# Patient Record
Sex: Female | Born: 1960 | Race: Black or African American | Hispanic: No | Marital: Married | Smoking: Never smoker
Health system: Southern US, Community
[De-identification: ages and names within clinical notes are randomized; demographics above are authoritative.]

## PROBLEM LIST (undated history)

## (undated) DIAGNOSIS — M1990 Unspecified osteoarthritis, unspecified site: Secondary | ICD-10-CM

## (undated) DIAGNOSIS — E739 Lactose intolerance, unspecified: Secondary | ICD-10-CM

## (undated) DIAGNOSIS — B1920 Unspecified viral hepatitis C without hepatic coma: Secondary | ICD-10-CM

## (undated) DIAGNOSIS — K219 Gastro-esophageal reflux disease without esophagitis: Secondary | ICD-10-CM

## (undated) DIAGNOSIS — E669 Obesity, unspecified: Secondary | ICD-10-CM

## (undated) DIAGNOSIS — R002 Palpitations: Secondary | ICD-10-CM

## (undated) DIAGNOSIS — M549 Dorsalgia, unspecified: Secondary | ICD-10-CM

## (undated) DIAGNOSIS — G43909 Migraine, unspecified, not intractable, without status migrainosus: Secondary | ICD-10-CM

## (undated) DIAGNOSIS — E559 Vitamin D deficiency, unspecified: Secondary | ICD-10-CM

## (undated) DIAGNOSIS — I10 Essential (primary) hypertension: Secondary | ICD-10-CM

## (undated) DIAGNOSIS — M25569 Pain in unspecified knee: Secondary | ICD-10-CM

## (undated) DIAGNOSIS — J45909 Unspecified asthma, uncomplicated: Secondary | ICD-10-CM

## (undated) DIAGNOSIS — T7840XA Allergy, unspecified, initial encounter: Secondary | ICD-10-CM

## (undated) DIAGNOSIS — Z91018 Allergy to other foods: Secondary | ICD-10-CM

## (undated) HISTORY — DX: Gastro-esophageal reflux disease without esophagitis: K21.9

## (undated) HISTORY — DX: Pain in unspecified knee: M25.569

## (undated) HISTORY — DX: Lactose intolerance, unspecified: E73.9

## (undated) HISTORY — DX: Allergy to other foods: Z91.018

## (undated) HISTORY — PX: POLYDACTYLY RECONSTRUCTION: SHX439

## (undated) HISTORY — DX: Unspecified viral hepatitis C without hepatic coma: B19.20

## (undated) HISTORY — DX: Allergy, unspecified, initial encounter: T78.40XA

## (undated) HISTORY — DX: Obesity, unspecified: E66.9

## (undated) HISTORY — DX: Unspecified osteoarthritis, unspecified site: M19.90

## (undated) HISTORY — DX: Dorsalgia, unspecified: M54.9

## (undated) HISTORY — DX: Palpitations: R00.2

## (undated) HISTORY — DX: Vitamin D deficiency, unspecified: E55.9

## (undated) HISTORY — DX: Migraine, unspecified, not intractable, without status migrainosus: G43.909

---

## 1990-09-03 HISTORY — PX: OVARIAN CYST REMOVAL: SHX89

## 1995-09-04 HISTORY — PX: ABDOMINAL HYSTERECTOMY: SHX81

## 2012-10-23 DIAGNOSIS — J40 Bronchitis, not specified as acute or chronic: Secondary | ICD-10-CM | POA: Insufficient documentation

## 2012-10-23 DIAGNOSIS — J329 Chronic sinusitis, unspecified: Secondary | ICD-10-CM | POA: Insufficient documentation

## 2016-01-02 DIAGNOSIS — R7402 Elevation of levels of lactic acid dehydrogenase (LDH): Secondary | ICD-10-CM | POA: Insufficient documentation

## 2016-01-05 DIAGNOSIS — E669 Obesity, unspecified: Secondary | ICD-10-CM | POA: Insufficient documentation

## 2016-05-31 DIAGNOSIS — R499 Unspecified voice and resonance disorder: Secondary | ICD-10-CM | POA: Insufficient documentation

## 2016-06-13 DIAGNOSIS — R490 Dysphonia: Secondary | ICD-10-CM | POA: Insufficient documentation

## 2016-06-13 DIAGNOSIS — K219 Gastro-esophageal reflux disease without esophagitis: Secondary | ICD-10-CM | POA: Insufficient documentation

## 2016-08-03 ENCOUNTER — Emergency Department (HOSPITAL_COMMUNITY)
Admission: EM | Admit: 2016-08-03 | Discharge: 2016-08-03 | Disposition: A | Discharge status: Home / Self Care | Payer: Self-pay | Attending: Emergency Medicine | Admitting: Emergency Medicine

## 2016-08-03 ENCOUNTER — Encounter (HOSPITAL_COMMUNITY): Payer: Self-pay

## 2016-08-03 ENCOUNTER — Inpatient Hospital Stay (HOSPITAL_COMMUNITY): Payer: BLUE CROSS/BLUE SHIELD

## 2016-08-03 ENCOUNTER — Encounter (HOSPITAL_COMMUNITY): Payer: Self-pay | Admitting: *Deleted

## 2016-08-03 ENCOUNTER — Inpatient Hospital Stay (HOSPITAL_COMMUNITY)
Admission: AD | Admit: 2016-08-03 | Discharge: 2016-08-03 | Disposition: A | Discharge status: Home / Self Care | Payer: BLUE CROSS/BLUE SHIELD | Source: Ambulatory Visit | Attending: Obstetrics & Gynecology | Admitting: Obstetrics & Gynecology

## 2016-08-03 DIAGNOSIS — K5900 Constipation, unspecified: Secondary | ICD-10-CM

## 2016-08-03 DIAGNOSIS — I10 Essential (primary) hypertension: Secondary | ICD-10-CM | POA: Diagnosis not present

## 2016-08-03 DIAGNOSIS — H8110 Benign paroxysmal vertigo, unspecified ear: Secondary | ICD-10-CM | POA: Diagnosis not present

## 2016-08-03 DIAGNOSIS — R200 Anesthesia of skin: Secondary | ICD-10-CM | POA: Diagnosis present

## 2016-08-03 DIAGNOSIS — J45909 Unspecified asthma, uncomplicated: Secondary | ICD-10-CM | POA: Insufficient documentation

## 2016-08-03 DIAGNOSIS — R109 Unspecified abdominal pain: Secondary | ICD-10-CM | POA: Insufficient documentation

## 2016-08-03 DIAGNOSIS — R42 Dizziness and giddiness: Secondary | ICD-10-CM | POA: Insufficient documentation

## 2016-08-03 DIAGNOSIS — Z5321 Procedure and treatment not carried out due to patient leaving prior to being seen by health care provider: Secondary | ICD-10-CM | POA: Insufficient documentation

## 2016-08-03 HISTORY — DX: Essential (primary) hypertension: I10

## 2016-08-03 HISTORY — DX: Unspecified asthma, uncomplicated: J45.909

## 2016-08-03 LAB — URINALYSIS, ROUTINE W REFLEX MICROSCOPIC
Bilirubin Urine: NEGATIVE
Glucose, UA: NEGATIVE mg/dL
HGB URINE DIPSTICK: NEGATIVE
Ketones, ur: NEGATIVE mg/dL
LEUKOCYTES UA: NEGATIVE
NITRITE: NEGATIVE
PROTEIN: NEGATIVE mg/dL
SPECIFIC GRAVITY, URINE: 1.015 (ref 1.005–1.030)
pH: 8 (ref 5.0–8.0)

## 2016-08-03 LAB — CBC
HCT: 43.5 % (ref 36.0–46.0)
HEMOGLOBIN: 15.1 g/dL — AB (ref 12.0–15.0)
MCH: 31.1 pg (ref 26.0–34.0)
MCHC: 34.7 g/dL (ref 30.0–36.0)
MCV: 89.5 fL (ref 78.0–100.0)
PLATELETS: 196 10*3/uL (ref 150–400)
RBC: 4.86 MIL/uL (ref 3.87–5.11)
RDW: 13.8 % (ref 11.5–15.5)
WBC: 4.5 10*3/uL (ref 4.0–10.5)

## 2016-08-03 LAB — BASIC METABOLIC PANEL
ANION GAP: 8 (ref 5–15)
BUN: 8 mg/dL (ref 6–20)
CHLORIDE: 102 mmol/L (ref 101–111)
CO2: 27 mmol/L (ref 22–32)
Calcium: 9.4 mg/dL (ref 8.9–10.3)
Creatinine, Ser: 0.66 mg/dL (ref 0.44–1.00)
Glucose, Bld: 115 mg/dL — ABNORMAL HIGH (ref 65–99)
Potassium: 3.2 mmol/L — ABNORMAL LOW (ref 3.5–5.1)
SODIUM: 137 mmol/L (ref 135–145)

## 2016-08-03 MED ORDER — POTASSIUM CHLORIDE CRYS ER 20 MEQ PO TBCR
40.0000 meq | EXTENDED_RELEASE_TABLET | Freq: Once | ORAL | Status: DC
  Filled 2016-08-03: qty 2

## 2016-08-03 MED ORDER — POLYETHYLENE GLYCOL 3350 17 G PO PACK: 17.0000 g | PACK | Freq: Every day | ORAL | 0 refills | Status: DC

## 2016-08-03 MED ORDER — ONDANSETRON 4 MG PO TBDP: 4.0000 mg | ORAL_TABLET | Freq: Once | ORAL | Status: DC

## 2016-08-03 MED ORDER — MECLIZINE HCL 25 MG PO TABS
25.0000 mg | ORAL_TABLET | Freq: Once | ORAL | Status: AC
  Administered 2016-08-03: 25 mg via ORAL
  Filled 2016-08-03: qty 1

## 2016-08-03 MED ORDER — MECLIZINE HCL 25 MG PO TABS: ORAL_TABLET | ORAL | 0 refills | Status: DC

## 2016-08-03 NOTE — ED Notes (Signed)
Pt family member came up to the desk very angry about wait time. Attempted to reassure family, pt appears to be resting well in wheelchair. Unable to be reasoned with, family member wheeled patient out of the ER. Unable to reassess vitals.

## 2016-08-03 NOTE — Discharge Instructions (Signed)
Epley Maneuver Self-Care WHAT IS THE EPLEY MANEUVER? The Epley maneuver is an exercise you can do to relieve symptoms of benign paroxysmal positional vertigo (BPPV). This condition is often just referred to as vertigo. BPPV is caused by the movement of tiny crystals (canaliths) inside your inner ear. The accumulation and movement of canaliths in your inner ear causes a sudden spinning sensation (vertigo) when you move your head to certain positions. Vertigo usually lasts about 30 seconds. BPPV usually occurs in just one ear. If you get vertigo when you lie on your left side, you probably have BPPV in your left ear. Your health care provider can tell you which ear is involved.  BPPV may be caused by a head injury. Many people older than 50 get BPPV for unknown reasons. If you have been diagnosed with BPPV, your health care provider may teach you how to do this maneuver. BPPV is not life threatening (benign) and usually goes away in time.  WHEN SHOULD I PERFORM THE EPLEY MANEUVER? You can do this maneuver at home whenever you have symptoms of vertigo. You may do the Epley maneuver up to 3 times a day until your symptoms of vertigo go away. HOW SHOULD I DO THE EPLEY MANEUVER? 1. Sit on the edge of a bed or table with your back straight. Your legs should be extended or hanging over the edge of the bed or table.  2. Turn your head halfway toward the affected ear.  3. Lie backward quickly with your head turned until you are lying flat on your back. You may want to position a pillow under your shoulders.  4. Hold this position for 30 seconds. You may experience an attack of vertigo. This is normal. Hold this position until the vertigo stops. 5. Then turn your head to the opposite direction until your unaffected ear is facing the floor.  6. Hold this position for 30 seconds. You may experience an attack of vertigo. This is normal. Hold this position until the vertigo stops. 7. Now turn your whole body to  the same side as your head. Hold for another 30 seconds.  8. You can then sit back up. ARE THERE RISKS TO THIS MANEUVER? In some cases, you may have other symptoms (such as changes in your vision, weakness, or numbness). If you have these symptoms, stop doing the maneuver and call your health care provider. Even if doing these maneuvers relieves your vertigo, you may still have dizziness. Dizziness is the sensation of light-headedness but without the sensation of movement. Even though the Epley maneuver may relieve your vertigo, it is possible that your symptoms will return within 5 years. WHAT SHOULD I DO AFTER THIS MANEUVER? After doing the Epley maneuver, you can return to your normal activities. Ask your doctor if there is anything you should do at home to prevent vertigo. This may include:  Sleeping with two or more pillows to keep your head elevated.  Not sleeping on the side of your affected ear.  Getting up slowly from bed.  Avoiding sudden movements during the day.  Avoiding extreme head movement, like looking up or bending over.  Wearing a cervical collar to prevent sudden head movements. WHAT SHOULD I DO IF MY SYMPTOMS GET WORSE? Call your health care provider if your vertigo gets worse. Call your provider right way if you have other symptoms, including:   Nausea.  Vomiting.  Headache.  Weakness.  Numbness.  Vision changes. This information is not intended to replace  advice given to you by your health care provider. Make sure you discuss any questions you have with your health care provider. Document Released: 08/25/2013 Document Reviewed: 08/25/2013 Elsevier Interactive Patient Education  2017 [employer]    Benign Positional Vertigo Introduction Vertigo is the feeling that you or your surroundings are moving when they are not. Benign positional vertigo is the most common form of vertigo. The cause of this condition is not serious (is benign). This condition is  triggered by certain movements and positions (is positional). This condition can be dangerous if it occurs while you are doing something that could endanger you or others, such as driving. What are the causes? In many cases, the cause of this condition is not known. It may be caused by a disturbance in an area of the inner ear that helps your brain to sense movement and balance. This disturbance can be caused by a viral infection (labyrinthitis), head injury, or repetitive motion. What increases the risk? This condition is more likely to develop in:  Women.  People who are 16 years of age or older. What are the signs or symptoms? Symptoms of this condition usually happen when you move your head or your eyes in different directions. Symptoms may start suddenly, and they usually last for less than a minute. Symptoms may include:  Loss of balance and falling.  Feeling like you are spinning or moving.  Feeling like your surroundings are spinning or moving.  Nausea and vomiting.  Blurred vision.  Dizziness.  Involuntary eye movement (nystagmus). Symptoms can be mild and cause only slight annoyance, or they can be severe and interfere with daily life. Episodes of benign positional vertigo may return (recur) over time, and they may be triggered by certain movements. Symptoms may improve over time. How is this diagnosed? This condition is usually diagnosed by medical history and a physical exam of the head, neck, and ears. You may be referred to a health care provider who specializes in ear, nose, and throat (ENT) problems (otolaryngologist) or a provider who specializes in disorders of the nervous system (neurologist). You may have additional testing, including:  MRI.  A CT scan.  Eye movement tests. Your health care provider may ask you to change positions quickly while he or she watches you for symptoms of benign positional vertigo, such as nystagmus. Eye movement may be tested with an  electronystagmogram (ENG), caloric stimulation, the Dix-Hallpike test, or the roll test.  An electroencephalogram (EEG). This records electrical activity in your brain.  Hearing tests. How is this treated? Usually, your health care provider will treat this by moving your head in specific positions to adjust your inner ear back to normal. Surgery may be needed in severe cases, but this is rare. In some cases, benign positional vertigo may resolve on its own in 2-4 weeks. Follow these instructions at home: Safety  Move slowly.Avoid sudden body or head movements.  Avoid driving.  Avoid operating heavy machinery.  Avoid doing any tasks that would be dangerous to you or others if a vertigo episode would occur.  If you have trouble walking or keeping your balance, try using a cane for stability. If you feel dizzy or unstable, sit down right away.  Return to your normal activities as told by your health care provider. Ask your health care provider what activities are safe for you. General instructions  Take over-the-counter and prescription medicines only as told by your health care provider.  Avoid certain positions or movements  as told by your health care provider.  Drink enough fluid to keep your urine clear or pale yellow.  Keep all follow-up visits as told by your health care provider. This is important. Contact a health care provider if:  You have a fever.  Your condition gets worse or you develop new symptoms.  Your family or friends notice any behavioral changes.  Your nausea or vomiting gets worse.  You have numbness or a pins and needles sensation. Get help right away if:  You have difficulty speaking or moving.  You are always dizzy.  You faint.  You develop severe headaches.  You have weakness in your legs or arms.  You have changes in your hearing or vision.  You develop a stiff neck.  You develop sensitivity to light. This information is not intended  to replace advice given to you by your health care provider. Make sure you discuss any questions you have with your health care provider. Document Released: 05/28/2006 Document Revised: 01/26/2016 Document Reviewed: 12/13/2014  2017 Elsevier

## 2016-08-03 NOTE — MAU Provider Note (Signed)
Chief Complaint: Back Pain; Dizziness; Emesis; and facial numbness (started today)   SUBJECTIVE HPI: Judith Morgan is a 55 y.o. [id number] at Unknown who presents [facility] reporting multiple complaints: right sided flank pain, dizziness/unable to walk, left sided facial paresthesia.  Patient reports since Monday of having right sided flank pain. This has been persistent for the past 5 days. Patient has tried half a tablet of 7.5/325 Norco's without relief. She has had normal daily soft bowel movements without hematochezia or melena. Patient states she does have to take a stool softener every day. She denies any nausea or vomiting until today, in which she had 2 episodes of nonbilious and nonbloody emesis. Patient states she tried Zofran without relief of her emesis.   She also started having episodes of dizziness today where she is having a difficult time walking. She says that her head just doesn't feel right with the dizziness.  She denies any headaches. She states for the past day or 2 she has also been experiencing left-sided facial numbness and tingling. She denies any urinary symptoms. She denies any visual problems.  She states her only medical problem is hypertension in which she takes medication for and has been well controlled. She states she has had a colonoscopy that was normal, and she has no history of diabetes with an A1c in the normal range per patient.  Patient was originally at the [facility] ED prior to arrival to the MAU. Patient was waiting for an hour and felt that she needed to be seen sooner and therefore she left the ER and came to the MAU. She refuses to go back to [facility] ER, states she would rather go home and deal with her pain and dizziness..  Past Medical History:  Diagnosis Date  . Asthma   . Hypertension    OB History  Gravida Para Term Preterm AB Living  5 3 3   2 2   SAB TAB Ectopic Multiple Live Births               # Outcome Date GA Lbr  Len/2nd Weight Sex Delivery Anes PTL Lv  5 AB           4 AB           3 Term           2 Term           1 Term              Past Surgical History:  Procedure Laterality Date  . ABDOMINAL HYSTERECTOMY    . OVARIAN CYST REMOVAL     Social History   Social History  . Marital status: Single    Spouse name: N/A  . Number of children: N/A  . Years of education: N/A   Occupational History  . Not on file.   Social History Main Topics  . Smoking status: Never Smoker  . Smokeless tobacco: Never Used  . Alcohol use No  . Drug use: No  . Sexual activity: Not on file   Other Topics Concern  . Not on file   Social History Narrative  . No narrative on file   No current facility-administered medications on file prior to encounter.    No current outpatient prescriptions on file prior to encounter.   Allergies  Allergen Reactions  . Other     Red peppers    I have reviewed the past Medical Hx, Surgical Hx, Social Hx,  Allergies and Medications.   REVIEW OF SYSTEMS  GENERAL: no fevers/chills, no fluctuation in her weight, +generalized weakness, +shakiness RESPIRATORY: no cough, shortness of breath, or wheezing CARDIOVASCULAR: no chest pain or dyspnea on exertion GASTROINTESTINAL: Right sided flank pain, denies change in bowel habits, or black or bloody stools.  GENITO-URINARY: no dysuria, trouble voiding, or hematuria MUSKULOSKELETAL: negative for - swelling in bilateral ankles, feet, legs;  Generalized weakness NEUROLOGICAL: +dizziness, +gait disturbance (2/2 dizziness), +facial numbness/tingling on left side, negative for -  Headaches, visual changes, slurred speech, facial drooping DERMATOLOGICAL: negative for rash   OBJECTIVE Patient Vitals for the past 24 hrs:  BP Temp Temp src Pulse Resp SpO2  08/03/16 1623 139/77 97.4 F (36.3 C) Oral 82 18 98 %    PHYSICAL EXAM Constitutional: Well-developed, well-nourished female in no acute distress.  HEENT: no scleral  icterus; PERRL Cardiovascular: normal rate, rhythm, no murmurs; no carotid bruits noted; good pulses bilateral wrist and ankles Respiratory: normal rate and effort, CTAB GI: Abd soft, tender on right side mid-abdomen flank area; non-distended. Pos BS x 4 MS: Extremities nontender, no edema, normal ROM Neurologic: Alert and oriented x 4; CN 2-12 grossly normal; pronator test normal; heel-to-shin test negative; when attempted to walk, became weak and shaky and felt dizzy, could not complete task.  GU: Neg CVAT.   LAB RESULTS Results for orders placed or performed during the hospital encounter of 08/03/16 (from the past 24 hour(s))  Urinalysis, Routine w reflex microscopic (not at [facility])     Status: None   Collection Time: 08/03/16  4:30 PM  Result Value Ref Range   Color, Urine YELLOW YELLOW   APPearance CLEAR CLEAR   Specific Gravity, Urine 1.015 1.005 - 1.030   pH 8.0 5.0 - 8.0   Glucose, UA NEGATIVE NEGATIVE mg/dL   Hgb urine dipstick NEGATIVE NEGATIVE   Bilirubin Urine NEGATIVE NEGATIVE   Ketones, ur NEGATIVE NEGATIVE mg/dL   Protein, ur NEGATIVE NEGATIVE mg/dL   Nitrite NEGATIVE NEGATIVE   Leukocytes, UA NEGATIVE NEGATIVE    IMAGING Dg Abd 2 Views  Result Date: 08/03/2016 CLINICAL DATA:  Right upper back pain, nausea and vomiting. EXAM: ABDOMEN - 2 VIEW COMPARISON:  None. FINDINGS: There is stool throughout large bowel consistent with constipation. No bowel obstruction is noted. There is no organomegaly nor pneumoperitoneum. No suspicious osseous abnormalities. No suspicious calculi. Small bone island or overlying phlebolith involving the right superior pubic ramus. Right intratrochanteric cystic nonaggressive appearing lesion possibly an intertrochanteric cyst or in the spectrum of fibro-osseous lesions. L4-5 and L5-S1 facet arthropathy. IMPRESSION: Increased colonic stool burden consistent with constipation. 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 Electronically Signed   By: Ashley Royalty M.D.   On: 08/03/2016 19:25    MAU COURSE Abd XR EKG Labs Orthostatics Meclizine Potassium - patient could not tolerate   MDM Plan of care reviewed with patient, including labs and tests ordered and medical treatment. Discussed if symptoms worsen or if stroke like symptoms occur, to return to an ER such as [facility] or [facility].    ASSESSMENT 1. Abdominal pain, unspecified abdominal location   2. Abdominal pain   3. Constipation, unspecified constipation type   4. Benign paroxysmal positional vertigo, unspecified laterality     PLAN Discharge home in stable condition. Follow up with PCP Return to another ER if emergency occurs.     Medication List    TAKE these medications   amLODipine 10 MG  tablet Commonly known as:  NORVASC Take 10 mg by mouth daily.   docusate sodium 50 MG capsule Commonly known as:  COLACE Take 50 mg by mouth 2 (two) times daily.   ibuprofen 200 MG tablet Commonly known as:  ADVIL,MOTRIN Take 200 mg by mouth every 6 (six) hours as needed for mild pain.   meclizine 25 MG tablet Commonly known as:  ANTIVERT Take 1-2 tabs PO BID as needed for dizziness.   multivitamin with minerals Tabs tablet Take 1 tablet by mouth daily.   omeprazole 40 MG capsule Commonly known as:  PRILOSEC Take 40 mg by mouth daily.   polyethylene glycol packet Commonly known as:  MIRALAX Take 17 g by mouth daily.   ranitidine 150 MG tablet Commonly known as:  ZANTAC Take 150 mg by mouth at bedtime.   triamterene-hydrochlorothiazide 37.5-25 MG capsule Commonly known as:  DYAZIDE Take 1 capsule by mouth daily.        Katherine Basset, DO [place] [facility] 08/03/2016 6:11 PM

## 2016-08-03 NOTE — ED Triage Notes (Signed)
Pt reports right flank pain since Monday. Denies urinary symptoms or fever. Reports onset yesterday of dizziness, today has left side facial tingling. No neuro deficits noted at triage, grips equal, no facial droop noted and speech is clear.

## 2016-08-03 NOTE — MAU Note (Signed)
Back pain since Monday night, onset of dizziness and left side of face numbness, vomited x 2 today, has history of asthma and high blood pressure, hysterectomy.

## 2016-08-15 ENCOUNTER — Emergency Department (HOSPITAL_COMMUNITY)
Admission: EM | Admit: 2016-08-15 | Discharge: 2016-08-15 | Disposition: A | Discharge status: Home / Self Care | Payer: BLUE CROSS/BLUE SHIELD | Attending: Emergency Medicine | Admitting: Emergency Medicine

## 2016-08-15 ENCOUNTER — Emergency Department (HOSPITAL_COMMUNITY): Payer: BLUE CROSS/BLUE SHIELD

## 2016-08-15 ENCOUNTER — Encounter (HOSPITAL_COMMUNITY): Payer: Self-pay | Admitting: Emergency Medicine

## 2016-08-15 DIAGNOSIS — Z79899 Other long term (current) drug therapy: Secondary | ICD-10-CM | POA: Diagnosis not present

## 2016-08-15 DIAGNOSIS — E876 Hypokalemia: Secondary | ICD-10-CM | POA: Insufficient documentation

## 2016-08-15 DIAGNOSIS — R109 Unspecified abdominal pain: Secondary | ICD-10-CM | POA: Diagnosis not present

## 2016-08-15 DIAGNOSIS — I10 Essential (primary) hypertension: Secondary | ICD-10-CM | POA: Insufficient documentation

## 2016-08-15 DIAGNOSIS — J45909 Unspecified asthma, uncomplicated: Secondary | ICD-10-CM | POA: Diagnosis not present

## 2016-08-15 LAB — URINALYSIS, ROUTINE W REFLEX MICROSCOPIC
BACTERIA UA: NONE SEEN
Bilirubin Urine: NEGATIVE
GLUCOSE, UA: NEGATIVE mg/dL
HGB URINE DIPSTICK: NEGATIVE
KETONES UR: NEGATIVE mg/dL
LEUKOCYTES UA: NEGATIVE
NITRITE: NEGATIVE
PROTEIN: 30 mg/dL — AB
Specific Gravity, Urine: 1.02 (ref 1.005–1.030)
WBC UA: NONE SEEN WBC/hpf (ref 0–5)
pH: 5 (ref 5.0–8.0)

## 2016-08-15 LAB — CBC WITH DIFFERENTIAL/PLATELET
BASOS ABS: 0 10*3/uL (ref 0.0–0.1)
BASOS PCT: 0 %
Eosinophils Absolute: 0.2 10*3/uL (ref 0.0–0.7)
Eosinophils Relative: 3 %
HEMATOCRIT: 46 % (ref 36.0–46.0)
HEMOGLOBIN: 15.8 g/dL — AB (ref 12.0–15.0)
Lymphocytes Relative: 51 %
Lymphs Abs: 2.6 10*3/uL (ref 0.7–4.0)
MCH: 31.4 pg (ref 26.0–34.0)
MCHC: 34.3 g/dL (ref 30.0–36.0)
MCV: 91.5 fL (ref 78.0–100.0)
Monocytes Absolute: 0.4 10*3/uL (ref 0.1–1.0)
Monocytes Relative: 8 %
NEUTROS ABS: 1.9 10*3/uL (ref 1.7–7.7)
NEUTROS PCT: 38 %
Platelets: 230 10*3/uL (ref 150–400)
RBC: 5.03 MIL/uL (ref 3.87–5.11)
RDW: 14 % (ref 11.5–15.5)
WBC: 5 10*3/uL (ref 4.0–10.5)

## 2016-08-15 LAB — COMPREHENSIVE METABOLIC PANEL
ALK PHOS: 119 U/L (ref 38–126)
ALT: 113 U/L — ABNORMAL HIGH (ref 14–54)
ANION GAP: 10 (ref 5–15)
AST: 114 U/L — ABNORMAL HIGH (ref 15–41)
Albumin: 4.4 g/dL (ref 3.5–5.0)
BILIRUBIN TOTAL: 1 mg/dL (ref 0.3–1.2)
BUN: 11 mg/dL (ref 6–20)
CALCIUM: 9.7 mg/dL (ref 8.9–10.3)
CO2: 28 mmol/L (ref 22–32)
Chloride: 101 mmol/L (ref 101–111)
Creatinine, Ser: 0.62 mg/dL (ref 0.44–1.00)
GFR calc non Af Amer: >60 mL/min (ref >60)
Glucose, Bld: 80 mg/dL (ref 65–99)
Potassium: 2.7 mmol/L — CL (ref 3.5–5.1)
SODIUM: 139 mmol/L (ref 135–145)
TOTAL PROTEIN: 9.1 g/dL — AB (ref 6.5–8.1)

## 2016-08-15 MED ORDER — IOPAMIDOL (ISOVUE-300) INJECTION 61%
100.0000 mL | Freq: Once | INTRAVENOUS | Status: AC | PRN
  Administered 2016-08-15: 100 mL via INTRAVENOUS

## 2016-08-15 MED ORDER — POTASSIUM CHLORIDE CRYS ER 20 MEQ PO TBCR
40.0000 meq | EXTENDED_RELEASE_TABLET | Freq: Once | ORAL | Status: AC
  Administered 2016-08-15: 40 meq via ORAL
  Filled 2016-08-15: qty 2

## 2016-08-15 MED ORDER — IOPAMIDOL (ISOVUE-300) INJECTION 61%
INTRAVENOUS | Status: AC
  Filled 2016-08-15: qty 100

## 2016-08-15 MED ORDER — POTASSIUM CHLORIDE CRYS ER 20 MEQ PO TBCR: 20.0000 meq | EXTENDED_RELEASE_TABLET | Freq: Two times a day (BID) | ORAL | 0 refills | Status: DC

## 2016-08-15 MED ORDER — SODIUM CHLORIDE 0.9 % IJ SOLN
INTRAMUSCULAR | Status: AC
  Filled 2016-08-15: qty 50

## 2016-08-15 MED ORDER — TRAMADOL HCL 50 MG PO TABS: 50.0000 mg | ORAL_TABLET | Freq: Four times a day (QID) | ORAL | 0 refills | Status: AC | PRN

## 2016-08-15 NOTE — ED Provider Notes (Signed)
[facility] DEPT Provider Note   CSN: [id number] Arrival date & time: 08/15/16  1203     History   Chief Complaint Chief Complaint  Patient presents with  . Flank Pain    HPI Judith Morgan is a 55 y.o. female.Complains of right flank pain, nonradiating onset 2.5 weeks ago. Pain is worse with moving and improved with remaining still. She's had intermittent vomiting for the past 2.5 weeks. She last vomited yesterday. She feels mildly nauseated now. Last bowel movement yesterday, normal. She was seen at [facility] on 08/03/2016 had lab evaluation which is normal. She has treated herself with Norco which made her feel nauseated and dizzy. She has also treated herself with ibuprofen which improved his pain. Pain is also improved by lying in right lateral decubitus position on a pillow. She denies abdominal pain denies chest pain denies cough denies shortness of breath. No other associated symptoms  HPI  Past Medical History:  Diagnosis Date  . Asthma   . Hypertension     There are no active problems to display for this patient.   Past Surgical History:  Procedure Laterality Date  . ABDOMINAL HYSTERECTOMY    . OVARIAN CYST REMOVAL      OB History    Gravida Para Term Preterm AB Living   5 3 3   2 2    SAB TAB Ectopic Multiple Live Births                   Home Medications    Prior to Admission medications   Medication Sig Start Date End Date Taking? Authorizing Provider  amLODipine (NORVASC) 10 MG tablet Take 10 mg by mouth daily.    Historical Provider, MD  docusate sodium (COLACE) 50 MG capsule Take 50 mg by mouth 2 (two) times daily.    Historical Provider, MD  ibuprofen (ADVIL,MOTRIN) 200 MG tablet Take 200 mg by mouth every 6 (six) hours as needed for mild pain.    Historical Provider, MD  meclizine (ANTIVERT) 25 MG tablet Take 1-2 tabs PO BID as needed for dizziness. 08/03/16   Lauralyn Primes Mumaw, DO  Multiple Vitamin (MULTIVITAMIN WITH MINERALS)  TABS tablet Take 1 tablet by mouth daily.    Historical Provider, MD  omeprazole (PRILOSEC) 40 MG capsule Take 40 mg by mouth daily.    Historical Provider, MD  polyethylene glycol (MIRALAX) packet Take 17 g by mouth daily. 08/03/16   Elizabeth Woodland Mumaw, DO  ranitidine (ZANTAC) 150 MG tablet Take 150 mg by mouth at bedtime.    Historical Provider, MD  triamterene-hydrochlorothiazide (DYAZIDE) 37.5-25 MG capsule Take 1 capsule by mouth daily.    Historical Provider, MD    Family History No family history on file.  Social History Social History  Substance Use Topics  . Smoking status: Never Smoker  . Smokeless tobacco: Never Used  . Alcohol use No     Allergies   Other   Review of Systems Review of Systems  Constitutional: Negative.   HENT: Negative.   Respiratory: Negative.   Cardiovascular: Negative.   Gastrointestinal: Positive for nausea and vomiting.  Genitourinary: Positive for flank pain.  Skin: Negative.   Neurological: Negative.   Psychiatric/Behavioral: Negative.   All other systems reviewed and are negative.    Physical Exam Updated Vital Signs BP 132/78   Pulse 82   Temp 97.8 F (36.6 C)   Resp 18   SpO2 94%   Physical Exam  Constitutional: She appears well-developed and  well-nourished.  HENT:  Head: Normocephalic and atraumatic.  Eyes: Conjunctivae are normal. Pupils are equal, round, and reactive to light.  Neck: Neck supple. No tracheal deviation present. No thyromegaly present.  Cardiovascular: Normal rate and regular rhythm.   No murmur heard. Pulmonary/Chest: Effort normal and breath sounds normal.  Abdominal: Soft. Bowel sounds are normal. She exhibits no distension. There is no tenderness.  Musculoskeletal: Normal range of motion. She exhibits no edema or tenderness.  Neurological: She is alert. Coordination normal.  Skin: Skin is warm and dry. No rash noted.  Psychiatric: She has a normal mood and affect.  Nursing note and vitals  reviewed.    ED Treatments / Results  Labs (all labs ordered are listed, but only abnormal results are displayed) Labs Reviewed  URINALYSIS, ROUTINE W REFLEX MICROSCOPIC  COMPREHENSIVE METABOLIC PANEL  CBC WITH DIFFERENTIAL/PLATELET  URINALYSIS, ROUTINE W REFLEX MICROSCOPIC    EKG  EKG Interpretation None       Radiology No results found.  Procedures Procedures (including critical care time)  Medications Ordered in ED Medications - No data to display  Results for orders placed or performed during the hospital encounter of 08/15/16  Comprehensive metabolic panel  Result Value Ref Range   Sodium 139 135 - 145 mmol/L   Potassium 2.7 (LL) 3.5 - 5.1 mmol/L   Chloride 101 101 - 111 mmol/L   CO2 28 22 - 32 mmol/L   Glucose, Bld 80 65 - 99 mg/dL   BUN 11 6 - 20 mg/dL   Creatinine, Ser 0.62 0.44 - 1.00 mg/dL   Calcium 9.7 8.9 - 10.3 mg/dL   Total Protein 9.1 (H) 6.5 - 8.1 g/dL   Albumin 4.4 3.5 - 5.0 g/dL   AST 114 (H) 15 - 41 U/L   ALT 113 (H) 14 - 54 U/L   Alkaline Phosphatase 119 38 - 126 U/L   Total Bilirubin 1.0 0.3 - 1.2 mg/dL   GFR calc non Af Amer >60 >60 mL/min   GFR calc Af Amer >60 >60 mL/min   Anion gap 10 5 - 15  CBC with Differential/Platelet  Result Value Ref Range   WBC 5.0 4.0 - 10.5 K/uL   RBC 5.03 3.87 - 5.11 MIL/uL   Hemoglobin 15.8 (H) 12.0 - 15.0 g/dL   HCT 46.0 36.0 - 46.0 %   MCV 91.5 78.0 - 100.0 fL   MCH 31.4 26.0 - 34.0 pg   MCHC 34.3 30.0 - 36.0 g/dL   RDW 14.0 11.5 - 15.5 %   Platelets 230 150 - 400 K/uL   Neutrophils Relative % 38 %   Neutro Abs 1.9 1.7 - 7.7 K/uL   Lymphocytes Relative 51 %   Lymphs Abs 2.6 0.7 - 4.0 K/uL   Monocytes Relative 8 %   Monocytes Absolute 0.4 0.1 - 1.0 K/uL   Eosinophils Relative 3 %   Eosinophils Absolute 0.2 0.0 - 0.7 K/uL   Basophils Relative 0 %   Basophils Absolute 0.0 0.0 - 0.1 K/uL  Urinalysis, Routine w reflex microscopic  Result Value Ref Range   Color, Urine AMBER (A) YELLOW    APPearance HAZY (A) CLEAR   Specific Gravity, Urine 1.020 1.005 - 1.030   pH 5.0 5.0 - 8.0   Glucose, UA NEGATIVE NEGATIVE mg/dL   Hgb urine dipstick NEGATIVE NEGATIVE   Bilirubin Urine NEGATIVE NEGATIVE   Ketones, ur NEGATIVE NEGATIVE mg/dL   Protein, ur 30 (A) NEGATIVE mg/dL   Nitrite NEGATIVE NEGATIVE  Leukocytes, UA NEGATIVE NEGATIVE   RBC / HPF 0-5 0 - 5 RBC/hpf   WBC, UA NONE SEEN 0 - 5 WBC/hpf   Bacteria, UA NONE SEEN NONE SEEN   Squamous Epithelial / LPF 0-5 (A) NONE SEEN   Mucous PRESENT    Ca Oxalate Crys, UA PRESENT    Ct Abdomen Pelvis W Contrast  Result Date: 08/15/2016 CLINICAL DATA:  55 year old female with a history of right flank pain for 2 weeks EXAM: CT ABDOMEN AND PELVIS WITH CONTRAST TECHNIQUE: Multidetector CT imaging of the abdomen and pelvis was performed using the standard protocol following bolus administration of intravenous contrast. CONTRAST:  131mL ISOVUE-300 IOPAMIDOL (ISOVUE-300) INJECTION 61% COMPARISON:  None. FINDINGS: Lower chest: Unremarkable lung bases. No pleural effusion. No pericardial fluid/ thickening. Hepatobiliary: No focal liver abnormality is seen. No gallstones, gallbladder wall thickening, or biliary dilatation.Unremarkable gallbladder. Pancreas: No peripancreatic fluid or inflammatory changes. Spleen: Normal in size without focal abnormality. Adrenals/Urinary Tract: Right adrenal gland unremarkable.  Left adrenal gland unremarkable. Right kidney: No right-sided hydronephrosis. No nephrolithiasis. Left Kidney: No left-sided hydronephrosis or nephrolithiasis. There is no evidence of left or right perinephric stranding. Course of the bilateral ureters unremarkable. Unremarkable appearance of the urinary bladder. Stomach/Bowel: Unremarkable appearance of stomach and small bowel. No transition point. Normal appendix. No significant stool burden. Significant diverticular changes. No evidence of acute inflammatory changes. Vascular/Lymphatic: No  significant vascular calcifications. No aneurysm. No adenopathy within the abdomen. Reproductive: Hysterectomy Other: Nonspecific pelvic floor laxity. Musculoskeletal: No displaced fracture. Mild degenerative changes of the visualized thoracolumbar spine. Mild degenerative changes of the bilateral hips. IMPRESSION: No acute finding to account for the patient's flank pain. Extensive diverticular changes without evidence of acute diverticulitis. Signed, Dulcy Fanny. Earleen Newport, DO [facility] Radiology Electronically Signed   By: Corrie Mckusick D.O.   On: 08/15/2016 18:58   Dg Abd 2 Views  Result Date: 08/03/2016 CLINICAL DATA:  Right upper back pain, nausea and vomiting. EXAM: ABDOMEN - 2 VIEW COMPARISON:  None. FINDINGS: There is stool throughout large bowel consistent with constipation. No bowel obstruction is noted. There is no organomegaly nor pneumoperitoneum. No suspicious osseous abnormalities. No suspicious calculi. Small bone island or overlying phlebolith involving the right superior pubic ramus. Right intratrochanteric cystic nonaggressive appearing lesion possibly an intertrochanteric cyst or in the spectrum of fibro-osseous lesions. L4-5 and L5-S1 facet arthropathy. IMPRESSION: Increased colonic stool burden consistent with constipation. 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 Electronically Signed   By: Ashley Royalty M.D.   On: 08/03/2016 19:25   Initial Impression / Assessment and Plan / ED Course  I have reviewed the triage vital signs and the nursing notes.  Pertinent labs & imaging results that were available during my care of the patient were reviewed by me and considered in my medical decision making (see chart for details).  Clinical Course    7:30 PM patient resting comfortably. Pain felt to be muscular in etiology. Etiology of vomiting is unclear however no nausea presently. Patient  hypokalemic. She'll receive potassium chloride oral supplement prior to presentation. Prescription K Dur, tramadol. Follow-up with PMD next week for recheck of serum potassium. Hypokalemia likely secondary to vomiting and triamterene-HCTZ  Final Clinical Impressions(s) / ED Diagnoses  Diagnosis #1 right flank pain #2 hypokalemia Final diagnoses:  None    New Prescriptions New Prescriptions   No medications on file     Orlie Dakin, MD 08/15/16 1937

## 2016-08-15 NOTE — ED Triage Notes (Signed)
Per pt, states right flank pain for 2 weeks-states no dysuria-states worked up at [employer] nausea and vomiting on and off for 2 weeks

## 2016-08-15 NOTE — Discharge Instructions (Signed)
Take the medications as prescribed. Contact your primary care physician tomorrow to arrange to be seen next week. Your blood potassium level should be rechecked. Today's was low at 2.7. Return if concern for any reason.

## 2016-08-15 NOTE — ED Notes (Signed)
Patient is refusing to allow staff to start IV unless it is in certain area of her arm.  Two staff members looked but no IV access.  Therefore, it is a delay in blood.

## 2016-11-06 ENCOUNTER — Other Ambulatory Visit: Payer: BLUE CROSS/BLUE SHIELD

## 2016-11-06 DIAGNOSIS — B182 Chronic viral hepatitis C: Secondary | ICD-10-CM

## 2016-11-06 LAB — COMPREHENSIVE METABOLIC PANEL
ALK PHOS: 142 U/L — AB (ref 33–130)
ALT: 131 U/L — AB (ref 6–29)
AST: 128 U/L — AB (ref 10–35)
Albumin: 3.9 g/dL (ref 3.6–5.1)
BILIRUBIN TOTAL: 0.6 mg/dL (ref 0.2–1.2)
BUN: 13 mg/dL (ref 7–25)
CO2: 28 mmol/L (ref 20–31)
CREATININE: 0.75 mg/dL (ref 0.50–1.05)
Calcium: 9.8 mg/dL (ref 8.6–10.4)
Chloride: 99 mmol/L (ref 98–110)
GLUCOSE: 100 mg/dL — AB (ref 65–99)
Potassium: 3.3 mmol/L — ABNORMAL LOW (ref 3.5–5.3)
Sodium: 139 mmol/L (ref 135–146)
Total Protein: 8 g/dL (ref 6.1–8.1)

## 2016-11-06 LAB — CBC WITH DIFFERENTIAL/PLATELET
BASOS PCT: 0 %
Basophils Absolute: 0 cells/uL (ref 0–200)
Eosinophils Absolute: 94 cells/uL (ref 15–500)
Eosinophils Relative: 2 %
HEMATOCRIT: 44.3 % (ref 35.0–45.0)
Hemoglobin: 15 g/dL (ref 11.7–15.5)
LYMPHS PCT: 45 %
Lymphs Abs: 2115 cells/uL (ref 850–3900)
MCH: 31.1 pg (ref 27.0–33.0)
MCHC: 33.9 g/dL (ref 32.0–36.0)
MCV: 91.9 fL (ref 80.0–100.0)
MONO ABS: 329 {cells}/uL (ref 200–950)
MONOS PCT: 7 %
MPV: 9.9 fL (ref 7.5–12.5)
NEUTROS ABS: 2162 {cells}/uL (ref 1500–7800)
Neutrophils Relative %: 46 %
PLATELETS: 215 10*3/uL (ref 140–400)
RBC: 4.82 MIL/uL (ref 3.80–5.10)
RDW: 13.8 % (ref 11.0–15.0)
WBC: 4.7 10*3/uL (ref 3.8–10.8)

## 2016-11-07 LAB — HEPATITIS B CORE ANTIBODY, TOTAL: Hep B Core Total Ab: NONREACTIVE

## 2016-11-07 LAB — HIV ANTIBODY (ROUTINE TESTING W REFLEX): HIV 1&2 Ab, 4th Generation: NONREACTIVE

## 2016-11-07 LAB — HEPATITIS B SURFACE ANTIGEN: Hepatitis B Surface Ag: NEGATIVE

## 2016-11-07 LAB — HEPATITIS A ANTIBODY, TOTAL: Hep A Total Ab: NONREACTIVE

## 2016-11-07 LAB — HEPATITIS B SURFACE ANTIBODY,QUALITATIVE: HEP B S AB: POSITIVE — AB

## 2016-11-07 LAB — PROTIME-INR
INR: 1.1
Prothrombin Time: 11.7 s — ABNORMAL HIGH (ref 9.0–11.5)

## 2016-11-14 LAB — HCV RNA,LIPA RFLX NS5A DRUG RESIST

## 2016-11-14 LAB — HCV RNA, QUANT REAL-TIME PCR W/REFLEX
HCV RNA, PCR, QN (Log): 5.7 LogIU/mL — ABNORMAL HIGH
HCV RNA, PCR, QN: 500000 IU/mL — ABNORMAL HIGH

## 2016-12-12 ENCOUNTER — Ambulatory Visit (INDEPENDENT_AMBULATORY_CARE_PROVIDER_SITE_OTHER): Payer: BLUE CROSS/BLUE SHIELD | Admitting: Internal Medicine

## 2016-12-12 ENCOUNTER — Encounter: Payer: Self-pay | Admitting: Internal Medicine

## 2016-12-12 VITALS — BP 118/79 | HR 76 | Temp 98.4°F | Ht 60.75 in | Wt 203.0 lb

## 2016-12-12 DIAGNOSIS — J4520 Mild intermittent asthma, uncomplicated: Secondary | ICD-10-CM

## 2016-12-12 DIAGNOSIS — E6609 Other obesity due to excess calories: Secondary | ICD-10-CM | POA: Diagnosis not present

## 2016-12-12 DIAGNOSIS — B182 Chronic viral hepatitis C: Secondary | ICD-10-CM | POA: Diagnosis not present

## 2016-12-12 DIAGNOSIS — B1920 Unspecified viral hepatitis C without hepatic coma: Secondary | ICD-10-CM | POA: Insufficient documentation

## 2016-12-12 DIAGNOSIS — Z23 Encounter for immunization: Secondary | ICD-10-CM

## 2016-12-12 DIAGNOSIS — J45909 Unspecified asthma, uncomplicated: Secondary | ICD-10-CM | POA: Insufficient documentation

## 2016-12-12 DIAGNOSIS — I10 Essential (primary) hypertension: Secondary | ICD-10-CM | POA: Diagnosis not present

## 2016-12-12 DIAGNOSIS — Z90710 Acquired absence of both cervix and uterus: Secondary | ICD-10-CM | POA: Insufficient documentation

## 2016-12-12 DIAGNOSIS — E669 Obesity, unspecified: Secondary | ICD-10-CM | POA: Insufficient documentation

## 2016-12-12 MED ORDER — LEDIPASVIR-SOFOSBUVIR 90-400 MG PO TABS: 1.0000 | ORAL_TABLET | Freq: Every day | ORAL | 2 refills | Status: DC

## 2016-12-12 NOTE — Progress Notes (Signed)
[facility] for Infectious Disease   CC: consideration for treatment for chronic hepatitis C  HPI:  +Judith Morgan is a 56 y.o. female who presents for initial evaluation and management of chronic hepatitis C.  Patient tested positive over 30 years ago. Hepatitis C-associated risk factors present are: none. Patient denies history of blood transfusion, IV drug abuse, multiple sexual partners, sexual contact with person with liver disease, tattoos. Patient has had other studies performed. Results: hepatitis C RNA by PCR, result: positive. Patient has not had prior treatment for Hepatitis C. Patient does not have a past history of liver disease. Patient does not have a family history of liver disease. Patient does not  have associated signs or symptoms related to liver disease.  Labs reviewed and confirm chronic hepatitis C with a positive viral load.   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       Patient does not have documented immunity to Hepatitis A. Patient does have documented immunity to Hepatitis B.    Review of Systems:   Constitutional: negative for malaise and anorexia Gastrointestinal: negative for diarrhea Integument/breast: negative for rash All other systems reviewed and are negative       Past Medical History:  Diagnosis Date  . Asthma   . Hypertension     Prior to Admission medications   Medication Sig Start Date End Date Taking? Authorizing Provider  amLODipine (NORVASC) 10 MG tablet Take 10 mg by mouth daily.   Yes Historical Provider, MD  docusate sodium (COLACE) 50 MG capsule Take 50 mg by mouth 2 (two) times daily.   Yes Historical Provider, MD  ibuprofen (ADVIL,MOTRIN) 200 MG tablet Take 800 mg by mouth every 6 (six) hours as needed for mild pain.    Yes Historical Provider, MD  meclizine (ANTIVERT) 25 MG tablet Take 1-2 tabs PO BID as needed for dizziness. 08/03/16  Yes Lauralyn Primes Mumaw, DO  Multiple Vitamin (MULTIVITAMIN WITH MINERALS) TABS tablet Take 1 tablet by mouth daily.   Yes Historical Provider, MD  ranitidine (ZANTAC) 150 MG tablet Take 150 mg by mouth at bedtime.   Yes Historical Provider, MD  triamterene-hydrochlorothiazide (MAXZIDE-25) 37.5-25 MG tablet Take 1 tablet by mouth daily. States does not take it daily 07/11/16  Yes Historical Provider, MD  Ledipasvir-Sofosbuvir (HARVONI) 90-400 MG TABS Take 1 tablet by mouth daily. 12/12/16   Thayer Headings, MD  potassium chloride SA (K-DUR,KLOR-CON) 20 MEQ tablet Take 1 tablet (20 mEq total) by mouth 2 (two) times daily. Patient not taking: Reported on 12/12/2016 08/15/16   Orlie Dakin, MD  traMADol (ULTRAM) 50 MG tablet Take 1 tablet (50 mg total) by mouth every 6 (six) hours as needed for moderate pain or severe pain. Patient not taking: Reported on 12/12/2016 08/15/16   Orlie Dakin, MD    Allergies  Allergen Reactions  . Other Itching    Red peppers    Social History  Substance Use Topics  . Smoking status: Never Smoker  . Smokeless tobacco: Never Used  . Alcohol use No    FMH: CHF in multiple family members   Objective:  Constitutional: in no apparent distress and alert,  Vitals:   12/12/16 0940  BP: 118/79  Pulse: 76  Temp: 98.4 F (36.9 C)   Eyes: anicteric Cardiovascular: Cor RRR Respiratory: CTA B; normal respiratory effort Gastrointestinal: Bowel sounds are normal, liver is not enlarged, spleen is not enlarged, soft,  nt Musculoskeletal: no pedal edema noted Skin: negatives: no rash; no porphyria cutanea tarda Lymphatic: no cervical lymphadenopathy   Laboratory Genotype: No results found for: HCVGENOTYPE HCV viral load: No results found for: HCVQUANT Lab Results  Component Value Date   WBC 4.7 11/06/2016   HGB 15.0 11/06/2016   HCT 44.3 11/06/2016   MCV 91.9 11/06/2016   PLT 215 11/06/2016    Lab Results  Component Value Date   CREATININE 0.75 11/06/2016    BUN 13 11/06/2016   NA 139 11/06/2016   K 3.3 (L) 11/06/2016   CL 99 11/06/2016   CO2 28 11/06/2016    Lab Results  Component Value Date   ALT 131 (H) 11/06/2016   AST 128 (H) 11/06/2016   ALKPHOS 142 (H) 11/06/2016     Labs and history reviewed and show CHILD-PUGH A  5-6 points: Child class A 7-9 points: Child class B 10-15 points: Child class C  Lab Results  Component Value Date   INR 1.1 11/06/2016   BILITOT 0.6 11/06/2016   ALBUMIN 3.9 11/06/2016     Assessment: New Patient with Chronic Hepatitis C genotype 1b, untreated.  I discussed with the patient the lab findings that confirm chronic hepatitis C as well as the natural history and progression of disease including about 30% of people who develop cirrhosis of the liver if left untreated and once cirrhosis is established there is a 2-7% risk per year of liver cancer and liver failure.  I discussed the importance of treatment and benefits in reducing the risk, even if significant liver fibrosis exists.   Plan: 1) Patient counseled extensively on limiting acetaminophen to no more than 2 grams daily, avoidance of alcohol. 2) Transmission discussed with patient including sexual transmission, sharing razors and toothbrush.   3) Will need referral to gastroenterology if concern for cirrhosis 4) Will need referral for substance abuse counseling: No.; Further work up to include urine drug screen  No. 5) Will prescribe Harvoni for 12 weeks 6) Hepatitis A vaccine Yes.   7) Hepatitis B vaccine No. 8) Pneumovax vaccine next visit, she did not want both shots today 9) Further work up to include liver staging with elastography 10) will follow up after starting medication 11) she will try to hold the Zantac or knows she may need to take at the same time as Harvoni if she needs to take it.

## 2016-12-12 NOTE — Patient Instructions (Signed)
Date 12/12/16  Dear Judith Morgan, As discussed in the [facility], your hepatitis C therapy will include the following medications:          Harvoni 90mg /400mg  tablet:           Take 1 tablet by mouth once daily   Please note that ALL MEDICATIONS WILL START ON THE SAME DATE for a total of 12 weeks. ---------------------------------------------------------------- Your HCV Treatment Start Date: TBA   Your HCV genotype:  1b    Liver Fibrosis: TBD    ---------------------------------------------------------------- YOUR PHARMACY CONTACT:   [facility] [address] [place], [place] [address] Phone: [phone] Hours: Monday to Friday 7:30 am to 6:00 pm   Please always contact your pharmacy at least 3-4 business days before you run out of medications to ensure your next month's medication is ready or 1 week prior to running out if you receive it by mail.  Remember, each prescription is for 28 days. ---------------------------------------------------------------- GENERAL NOTES REGARDING YOUR HEPATITIS C MEDICATION:  SOFOSBUVIR/LEDIPASVIR (HARVONI): - Harvoni tablet is taken daily with OR without food. - The tablets are orange. - The tablets should be stored at room temperature.  - Acid reducing agents such as H2 blockers (ie. Pepcid (famotidine), Zantac (ranitidine), Tagamet (cimetidine), Axid (nizatidine) and proton pump inhibitors (ie. Prilosec (omeprazole), Protonix (pantoprazole), Nexium (esomeprazole), or Aciphex (rabeprazole)) can decrease effectiveness of Harvoni. Do not take until you have discussed with a health care provider.    -Antacids that contain magnesium and/or aluminum hydroxide (ie. Milk of Magensia, Rolaids, Gaviscon, Maalox, Mylanta, an dArthritis Pain Formula)can reduce absorption of Harvoni, so take them at least 4 hours before or after Harvoni.  -Calcium carbonate (calcium supplements or antacids such as Tums, Caltrate, Os-Cal)needs to be taken at  least 4 hours hours before or after Harvoni.  -St. John's wort or any products that contain St. John's wort like some herbal supplements  Please inform the office prior to starting any of these medications.  - The common side effects associated with Harvoni include:      1. Fatigue      2. Headache      3. Nausea      4. Diarrhea      5. Insomnia  Please note that this only lists the most common side effects and is NOT a comprehensive list of the potential side effects of these medications. For more information, please review the drug information sheets that come with your medication package from the pharmacy.  ---------------------------------------------------------------- GENERAL HELPFUL HINTS ON HCV THERAPY: 1. Stay well-hydrated. 2. Notify the [facility] of any changes in your other over-the-counter/herbal or prescription medications. 3. If you miss a dose of your medication, take the missed dose as soon as you remember. Return to your regular time/dose schedule the next day.  4.  Do not stop taking your medications without first talking with your healthcare provider. 5.  You may take Tylenol (acetaminophen), as long as the dose is less than 2000 mg (OR no more than 4 tablets of the Tylenol Extra Strengths 500mg  tablet) in 24 hours. 6.  You will see our pharmacist-specialist within the first 2 weeks of starting your medication to monitor for any possible side effects. 7.  You will have labs once during treatment, after soon after treatment completion and one final lab 6 months after treatment completion to verify the virus is out of your system.  Judith Morgan, [place] [facility] [employer] [place], [place]  [id number] [phone]

## 2017-01-03 ENCOUNTER — Ambulatory Visit (HOSPITAL_COMMUNITY)
Admission: RE | Admit: 2017-01-03 | Discharge: 2017-01-03 | Disposition: A | Discharge status: Home / Self Care | Payer: No Typology Code available for payment source | Source: Ambulatory Visit | Attending: Internal Medicine | Admitting: Internal Medicine

## 2017-01-03 DIAGNOSIS — B182 Chronic viral hepatitis C: Secondary | ICD-10-CM | POA: Insufficient documentation

## 2017-01-03 DIAGNOSIS — K740 Hepatic fibrosis: Secondary | ICD-10-CM | POA: Diagnosis not present

## 2017-01-10 ENCOUNTER — Other Ambulatory Visit: Payer: Self-pay | Admitting: Pharmacist Clinician (PhC)/ Clinical Pharmacy Specialist

## 2017-01-10 MED ORDER — LEDIPASVIR-SOFOSBUVIR 90-400 MG PO TABS: 1.0000 | ORAL_TABLET | Freq: Every day | ORAL | 2 refills | Status: DC

## 2017-01-10 NOTE — Progress Notes (Signed)
Have to be sent to Judith Morgan[facility] in [facility]

## 2017-01-11 ENCOUNTER — Telehealth: Payer: Self-pay | Admitting: *Deleted

## 2017-01-11 MED ORDER — LEDIPASVIR-SOFOSBUVIR 90-400 MG PO TABS: 1.0000 | ORAL_TABLET | Freq: Every day | ORAL | 2 refills | Status: DC

## 2017-01-11 NOTE — Telephone Encounter (Signed)
Patient pharmacy called to advise that this medication has to come from a mail order company per her insurance. [facility] is the choice. Will resend the Rx to [facility].

## 2017-01-15 ENCOUNTER — Encounter: Payer: Self-pay | Admitting: Pharmacy Technician

## 2017-02-07 ENCOUNTER — Ambulatory Visit (INDEPENDENT_AMBULATORY_CARE_PROVIDER_SITE_OTHER): Payer: PRIVATE HEALTH INSURANCE | Admitting: Pharmacist Clinician (PhC)/ Clinical Pharmacy Specialist

## 2017-02-07 DIAGNOSIS — B182 Chronic viral hepatitis C: Secondary | ICD-10-CM | POA: Diagnosis not present

## 2017-02-07 LAB — COMPLETE METABOLIC PANEL WITH GFR
ALBUMIN: 3.8 g/dL (ref 3.6–5.1)
ALK PHOS: 125 U/L (ref 33–130)
ALT: 20 U/L (ref 6–29)
AST: 26 U/L (ref 10–35)
BUN: 13 mg/dL (ref 7–25)
CHLORIDE: 102 mmol/L (ref 98–110)
CO2: 27 mmol/L (ref 20–31)
CREATININE: 0.62 mg/dL (ref 0.50–1.05)
Calcium: 9.2 mg/dL (ref 8.6–10.4)
GFR, Est African American: >89 mL/min (ref >=60)
GFR, Est Non African American: >89 mL/min (ref >=60)
GLUCOSE: 93 mg/dL (ref 65–99)
POTASSIUM: 3.6 mmol/L (ref 3.5–5.3)
SODIUM: 138 mmol/L (ref 135–146)
Total Bilirubin: 0.4 mg/dL (ref 0.2–1.2)
Total Protein: 7.5 g/dL (ref 6.1–8.1)

## 2017-02-07 NOTE — Progress Notes (Signed)
HPI: Judith Morgan is a 56 y.o. female who is here for her hep C follow up.   No results found for: HCVGENOTYPE, HEPCGENOTYPE  Allergies: Allergies  Allergen Reactions  . Other Itching    Red peppers    Vitals:    Past Medical History: Past Medical History:  Diagnosis Date  . Asthma   . Hypertension     Social History: Social History   Social History  . Marital status: Single    Spouse name: N/A  . Number of children: N/A  . Years of education: N/A   Social History Main Topics  . Smoking status: Never Smoker  . Smokeless tobacco: Never Used  . Alcohol use No  . Drug use: No  . Sexual activity: Yes    Partners: Male   Other Topics Concern  . Not on file   Social History Narrative  . No narrative on file    Labs: Hep B S Ab (no units)  Date Value  11/06/2016 POS (A)   Hepatitis B Surface Ag (no units)  Date Value  11/06/2016 NEGATIVE    No results found for: HCVGENOTYPE, HEPCGENOTYPE  No flowsheet data found.  AST (U/L)  Date Value  11/06/2016 128 (H)  08/15/2016 114 (H)   ALT (U/L)  Date Value  11/06/2016 131 (H)  08/15/2016 113 (H)   INR (no units)  Date Value  11/06/2016 1.1    CrCl: CrCl cannot be calculated (Patient's most recent lab result is older than the maximum 21 days allowed.).  Fibrosis Score: F3/4 as assessed by [facility]FI  Child-Pugh Score: Class A  Previous Treatment Regimen: None  Assessment: Judith Morgan started on Harvoni on 5/12 for her hepatitis C. She is very religious about her medication. She has not missed any doses. The only side effect is fatigue. Explained to her that fatigue can be common in people on Harvoni. She said that it's pretty tolerable. Since she is almost a month in, we are going to get a VL today. She has a hx of elevated LFTs so we are going to repeat that, too. Her plts are >150k. I got a feeling that she would like to see Dr. Linus Morgan for the cure visit to get an idea of future management after she  is cure.   Recommendations:  Cont Harvoni 1 PO qday Hep C VL/CMP today EOT hep C VL then SVR12 F/u with Dr. Linus Morgan for the cure visit  [facility], [place].D., BCPS, AAHIVP Clinical Infectious [facility] for [facility] 02/07/2017, 10:21 AM

## 2017-02-11 LAB — HEPATITIS C RNA QUANTITATIVE
HCV QUANT LOG: NOT DETECTED {Log_IU}/mL
HCV QUANT: NOT DETECTED [IU]/mL

## 2017-04-11 ENCOUNTER — Ambulatory Visit (INDEPENDENT_AMBULATORY_CARE_PROVIDER_SITE_OTHER): Payer: Self-pay | Admitting: Pharmacist Clinician (PhC)/ Clinical Pharmacy Specialist

## 2017-04-11 DIAGNOSIS — B182 Chronic viral hepatitis C: Secondary | ICD-10-CM

## 2017-04-11 LAB — COMPLETE METABOLIC PANEL WITH GFR
ALBUMIN: 4 g/dL (ref 3.6–5.1)
ALT: 22 U/L (ref 6–29)
AST: 27 U/L (ref 10–35)
Alkaline Phosphatase: 118 U/L (ref 33–130)
BUN: 11 mg/dL (ref 7–25)
CALCIUM: 9.6 mg/dL (ref 8.6–10.4)
CO2: 27 mmol/L (ref 20–32)
Chloride: 103 mmol/L (ref 98–110)
Creat: 0.66 mg/dL (ref 0.50–1.05)
GFR, Est Non African American: >89 mL/min (ref >=60)
Glucose, Bld: 103 mg/dL — ABNORMAL HIGH (ref 65–99)
POTASSIUM: 3.7 mmol/L (ref 3.5–5.3)
Sodium: 140 mmol/L (ref 135–146)
Total Bilirubin: 0.4 mg/dL (ref 0.2–1.2)
Total Protein: 7.6 g/dL (ref 6.1–8.1)

## 2017-04-11 NOTE — Progress Notes (Signed)
HPI: Judith Morgan is a 56 y.o. female who is here for her EOT hep C visit  No results found for: HCVGENOTYPE, HEPCGENOTYPE  Allergies: Allergies  Allergen Reactions  . Other Itching    Red peppers    Vitals:    Past Medical History: Past Medical History:  Diagnosis Date  . Asthma   . Hypertension     Social History: Social History   Social History  . Marital status: Single    Spouse name: N/A  . Number of children: N/A  . Years of education: N/A   Social History Main Topics  . Smoking status: Never Smoker  . Smokeless tobacco: Never Used  . Alcohol use No  . Drug use: No  . Sexual activity: Yes    Partners: Male   Other Topics Concern  . Not on file   Social History Narrative  . No narrative on file    Labs: Hep B S Ab (no units)  Date Value  11/06/2016 POS (A)   Hepatitis B Surface Ag (no units)  Date Value  11/06/2016 NEGATIVE    No results found for: HCVGENOTYPE, HEPCGENOTYPE  Hepatitis C RNA quantitative Latest Ref Rng & Units 02/07/2017  HCV Quantitative NOT DETECTED IU/mL <15 NOT DETECTED  HCV Quantitative Log NOT DETECTED Log IU/mL <1.18 NOT DETECTED    AST (U/L)  Date Value  02/07/2017 26  11/06/2016 128 (H)  08/15/2016 114 (H)   ALT (U/L)  Date Value  02/07/2017 20  11/06/2016 131 (H)  08/15/2016 113 (H)   INR (no units)  Date Value  11/06/2016 1.1    CrCl: CrCl cannot be calculated (Patient's most recent lab result is older than the maximum 21 days allowed.).  Fibrosis Score: F3/4 as assessed by [facility]FO  Child-Pugh Score: Class A  Previous Treatment Regimen: None  Assessment: [name] finished her 3 months course of her Harvoni last week. She didn't miss any doses. She still feels tired even being off the drug now. Advised that the drug should be washed out of her system very soon. She will come back in Dec for her SVR12 then f/u with Dr. Linus Salmons for the cure visit. She is an Corporate treasurer at [name]. She has F3/4  with a plt of >150k. At the last visit, she mentioned that she would like to f/u with Dr. Linus Salmons for the cure visit. She will need her second hep A at that visit also.  Recommendations:  EOT hep C VL today SVR12 in Dec then cure visit with Dr. Linus Salmons Hep A #2 at cure visit  [facility], [place].D., BCPS, AAHIVP Clinical Infectious [place] [facility] 04/11/2017, 9:55 AM

## 2017-04-11 NOTE — Patient Instructions (Addendum)
Labs today Follow up for labs on 12/10 at 0930 Follow up with Dr. Linus Salmons for the cure visit on 12/18 at 0930

## 2017-04-16 LAB — HEPATITIS C RNA QUANTITATIVE
HCV QUANT: NOT DETECTED [IU]/mL
HCV Quantitative Log: <1.18 Log IU/mL

## 2017-06-20 DIAGNOSIS — M549 Dorsalgia, unspecified: Secondary | ICD-10-CM | POA: Diagnosis not present

## 2017-06-20 DIAGNOSIS — I10 Essential (primary) hypertension: Secondary | ICD-10-CM | POA: Diagnosis not present

## 2017-06-20 DIAGNOSIS — Z1231 Encounter for screening mammogram for malignant neoplasm of breast: Secondary | ICD-10-CM | POA: Diagnosis not present

## 2017-07-16 DIAGNOSIS — Z23 Encounter for immunization: Secondary | ICD-10-CM | POA: Diagnosis not present

## 2017-08-12 ENCOUNTER — Other Ambulatory Visit: Payer: PRIVATE HEALTH INSURANCE

## 2017-08-20 ENCOUNTER — Ambulatory Visit: Payer: PRIVATE HEALTH INSURANCE | Admitting: Internal Medicine

## 2017-08-22 ENCOUNTER — Other Ambulatory Visit: Payer: 59

## 2017-08-22 DIAGNOSIS — B182 Chronic viral hepatitis C: Secondary | ICD-10-CM

## 2017-08-22 LAB — COMPLETE METABOLIC PANEL WITH GFR
AG Ratio: 1.2 (calc) (ref 1.0–2.5)
ALBUMIN MSPROF: 3.8 g/dL (ref 3.6–5.1)
ALT: 18 U/L (ref 6–29)
AST: 26 U/L (ref 10–35)
Alkaline phosphatase (APISO): 95 U/L (ref 33–130)
BILIRUBIN TOTAL: 0.7 mg/dL (ref 0.2–1.2)
BUN: 11 mg/dL (ref 7–25)
CHLORIDE: 103 mmol/L (ref 98–110)
CO2: 26 mmol/L (ref 20–32)
CREATININE: 0.65 mg/dL (ref 0.50–1.05)
Calcium: 9.3 mg/dL (ref 8.6–10.4)
GFR, EST AFRICAN AMERICAN: 116 mL/min/{1.73_m2} (ref >=60)
GFR, Est Non African American: 100 mL/min/{1.73_m2} (ref >=60)
GLUCOSE: 107 mg/dL — AB (ref 65–99)
Globulin: 3.1 g/dL (calc) (ref 1.9–3.7)
Potassium: 3.8 mmol/L (ref 3.5–5.3)
Sodium: 138 mmol/L (ref 135–146)
Total Protein: 6.9 g/dL (ref 6.1–8.1)

## 2017-08-26 LAB — HEPATITIS C RNA QUANTITATIVE
HCV Quantitative Log: <1.18 Log IU/mL
HCV RNA, PCR, QN: NOT DETECTED [IU]/mL

## 2017-09-12 ENCOUNTER — Encounter: Payer: Self-pay | Admitting: Internal Medicine

## 2017-09-12 ENCOUNTER — Ambulatory Visit (INDEPENDENT_AMBULATORY_CARE_PROVIDER_SITE_OTHER): Payer: 59 | Admitting: Internal Medicine

## 2017-09-12 VITALS — BP 136/86 | HR 74 | Temp 98.0°F

## 2017-09-12 DIAGNOSIS — K7460 Unspecified cirrhosis of liver: Secondary | ICD-10-CM

## 2017-09-12 DIAGNOSIS — B182 Chronic viral hepatitis C: Secondary | ICD-10-CM

## 2017-09-12 DIAGNOSIS — E6609 Other obesity due to excess calories: Secondary | ICD-10-CM

## 2017-09-12 NOTE — Progress Notes (Signed)
   Subjective:    Patient ID: Judith Morgan, female    DOB: 03/25/1961, 57 y.o.   MRN: [id number]  HPI Here for follow up of chronic hepatitis C.  She completed 12 weeks of harvoni for Genotype 1 chronic hepatitis C.  She also has F3/4 on elastography.  She has never seen a gastroenterologist.  Her SVR 12 (5 months) is negative confirming cure.  She is surprised this is the case though never missed a dose.  No cirrhosis on ultrasound.     Review of Systems  Constitutional: Negative for fatigue.  Gastrointestinal: Negative for diarrhea.  Neurological: Negative for dizziness.       Objective:   Physical Exam  Constitutional: She appears well-developed and well-nourished. No distress.  HENT:  Mouth/Throat: No oropharyngeal exudate.  Eyes: No scleral icterus.  Cardiovascular: Normal rate, regular rhythm and normal heart sounds.  No murmur heard. Pulmonary/Chest: Effort normal and breath sounds normal. No respiratory distress.  Skin: No rash noted.   SH; no alcohol       Assessment & Plan:

## 2017-09-13 NOTE — Assessment & Plan Note (Signed)
I discussed liver care with her including avoiding alcohol (does not ever drink), benefits of caffeine, good weight control and diet.

## 2017-09-13 NOTE — Assessment & Plan Note (Signed)
By elastography; will do [place] screening and schedule ultrasound now and in 6 months. She will follow up with me in 1 year I will refer to GI for ? EGD, if indicated.  I discussed with her the reasons for the EGD and [place] screening.

## 2017-09-13 NOTE — Assessment & Plan Note (Signed)
I related the information that she is now considered cured. She is pleased and surprised since she feels good news does not typically happen to her.

## 2017-09-20 ENCOUNTER — Ambulatory Visit
Admission: RE | Admit: 2017-09-20 | Discharge: 2017-09-20 | Disposition: A | Discharge status: Home / Self Care | Payer: 59 | Source: Ambulatory Visit | Attending: Internal Medicine | Admitting: Internal Medicine

## 2017-09-20 DIAGNOSIS — B182 Chronic viral hepatitis C: Secondary | ICD-10-CM

## 2017-09-20 DIAGNOSIS — K7460 Unspecified cirrhosis of liver: Secondary | ICD-10-CM

## 2017-10-17 DIAGNOSIS — R05 Cough: Secondary | ICD-10-CM | POA: Diagnosis not present

## 2017-10-17 DIAGNOSIS — J101 Influenza due to other identified influenza virus with other respiratory manifestations: Secondary | ICD-10-CM | POA: Diagnosis not present

## 2017-10-17 DIAGNOSIS — J45909 Unspecified asthma, uncomplicated: Secondary | ICD-10-CM | POA: Diagnosis not present

## 2017-10-22 ENCOUNTER — Ambulatory Visit (INDEPENDENT_AMBULATORY_CARE_PROVIDER_SITE_OTHER): Payer: 59 | Admitting: Internal Medicine

## 2017-10-22 ENCOUNTER — Encounter: Payer: Self-pay | Admitting: Internal Medicine

## 2017-10-22 ENCOUNTER — Encounter (INDEPENDENT_AMBULATORY_CARE_PROVIDER_SITE_OTHER): Payer: Self-pay

## 2017-10-22 VITALS — BP 138/80 | HR 76 | Ht 60.75 in | Wt 222.1 lb

## 2017-10-22 DIAGNOSIS — Z1211 Encounter for screening for malignant neoplasm of colon: Secondary | ICD-10-CM

## 2017-10-22 DIAGNOSIS — Z6841 Body Mass Index (BMI) 40.0 and over, adult: Secondary | ICD-10-CM

## 2017-10-22 DIAGNOSIS — K7460 Unspecified cirrhosis of liver: Secondary | ICD-10-CM | POA: Diagnosis not present

## 2017-10-22 DIAGNOSIS — Z8619 Personal history of other infectious and parasitic diseases: Secondary | ICD-10-CM | POA: Diagnosis not present

## 2017-10-22 MED ORDER — PEG-KCL-NACL-NASULF-NA ASC-C 140 G PO SOLR: 1.0000 | Freq: Once | ORAL | 0 refills | Status: AC

## 2017-10-22 NOTE — Progress Notes (Signed)
HISTORY OF PRESENT ILLNESS:  Judith Morgan is a 57 y.o. 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 Linus Salmons regarding a history of hepatitis C with possible cirrhosis and the need for upper endoscopy. Patient has a history of long-standing hepatitis C for which she completed 12 weeks of Harvoni for genotype 1 chronic hepatitis C. Also with F3/4 score on elastography. No prior GI history or GI evaluations. Her SVR is negative consistent with cure. Review of outside ultrasound from May 2018 finds the liver parenchyma to be normal. Normal spleen size. CT scan from December 2017 also with normal liver and spleen. Review of outside laboratories has shown normal CBC including platelets. Normal prothrombin time. Normal liver tests and albumin. Patient does not use alcohol. She does have chronic stable constipation since childhood. Some bloating. GI review of systems is otherwise negative. He has not had colon cancer screening but is interested in screening colonoscopy. No family history of colon cancer or liver disease.  REVIEW OF SYSTEMS:  All non-GI ROS negative unless otherwise stated in the history of present illness except for arthritis, headaches  Past Medical History:  Diagnosis Date  . Arthritis   . Asthma   . GERD (gastroesophageal reflux disease)   . Hepatitis C   . Hypertension   . Migraines   . Obesity     Past Surgical History:  Procedure Laterality Date  . ABDOMINAL HYSTERECTOMY  1997   left ovaries  . OVARIAN CYST REMOVAL Right 1992   ruptured cyst    Social History Judith Morgan  reports that  has never smoked. she has never used smokeless tobacco. She reports that she does not drink alcohol or use drugs.  family history includes Dementia in her maternal grandmother; Diabetes in her father, maternal aunt, and mother; Heart disease in her mother; Heart failure in her father and paternal grandmother;  Hypertension in her maternal aunt; Prostate cancer in her father; Stroke in her maternal aunt.  Allergies  Allergen Reactions  . Norco [Hydrocodone-Acetaminophen] Nausea And Vomiting    Vicodin and that class of meds   . Other Itching    Red peppers       PHYSICAL EXAMINATION: Vital signs: BP 138/80 (BP Location: Left Arm, Patient Position: Sitting, Cuff Size: Normal)   Pulse 76   Ht 5' 0.75" (1.543 m) Comment: pt refused  Wt 222 lb 2 oz (100.8 kg)   BMI 42.32 kg/m   Constitutional: Pleasant, generally well-appearing, no acute distress Psychiatric: alert and oriented x3, cooperative Eyes: extraocular movements intact, anicteric, conjunctiva pink Mouth: oral pharynx moist, no lesions Neck: supple without thyromegaly Lymph: no lymphadenopathy Cardiovascular: heart regular rate and rhythm, no murmur Lungs: clear to auscultation bilaterally Abdomen: soft, nontender, nondistended, no obvious ascites, no peritoneal signs, normal bowel sounds, no organomegaly Rectal: Deferred until colonoscopy Extremities: no clubbing, cyanosis, or lower extremity edema bilaterally Skin: no lesions on visible extremities Neuro: No focal deficits. Cranial nerves intact. No asterixis.  ASSESSMENT:  #1. Chronic hepatitis C genotype 1 status post successful eradication with Harvoni therapy #2. Elastography reason the question of fibrosis. No additional evidence for advanced liver disease or portal hypertension. #3. Obesity #4. Colon cancer screening  PLAN:  #1. Exercise and weight loss #2. Schedule upper endoscopy to screen for varices.The nature of the procedure, as well as the risks, benefits, and alternatives were carefully and thoroughly reviewed with the patient. Ample time for discussion and questions allowed. The  patient understood, was satisfied, and agreed to proceed. #4. Screening colonoscopy.The nature of the procedure, as well as the risks, benefits, and alternatives were carefully and  thoroughly reviewed with the patient. Ample time for discussion and questions allowed. The patient understood, was satisfied, and agreed to proceed.  The copy of this consultation note has been sent to Dr. Linus Salmons

## 2017-10-22 NOTE — Patient Instructions (Signed)

## 2017-10-24 DIAGNOSIS — J45909 Unspecified asthma, uncomplicated: Secondary | ICD-10-CM | POA: Diagnosis not present

## 2017-10-24 DIAGNOSIS — E669 Obesity, unspecified: Secondary | ICD-10-CM | POA: Diagnosis not present

## 2017-10-24 DIAGNOSIS — R05 Cough: Secondary | ICD-10-CM | POA: Diagnosis not present

## 2017-10-24 DIAGNOSIS — I10 Essential (primary) hypertension: Secondary | ICD-10-CM | POA: Diagnosis not present

## 2017-12-24 ENCOUNTER — Encounter: Payer: Self-pay | Admitting: Internal Medicine

## 2018-01-07 ENCOUNTER — Encounter: Payer: Self-pay | Admitting: Internal Medicine

## 2018-01-07 ENCOUNTER — Other Ambulatory Visit: Payer: Self-pay

## 2018-01-07 ENCOUNTER — Ambulatory Visit (AMBULATORY_SURGERY_CENTER): Payer: 59 | Admitting: Internal Medicine

## 2018-01-07 VITALS — BP 124/61 | HR 81 | Temp 98.6°F | Resp 13 | Ht 60.0 in | Wt 222.0 lb

## 2018-01-07 DIAGNOSIS — K7469 Other cirrhosis of liver: Secondary | ICD-10-CM | POA: Diagnosis not present

## 2018-01-07 DIAGNOSIS — Z1211 Encounter for screening for malignant neoplasm of colon: Secondary | ICD-10-CM

## 2018-01-07 DIAGNOSIS — D120 Benign neoplasm of cecum: Secondary | ICD-10-CM

## 2018-01-07 DIAGNOSIS — I10 Essential (primary) hypertension: Secondary | ICD-10-CM | POA: Diagnosis not present

## 2018-01-07 DIAGNOSIS — K7460 Unspecified cirrhosis of liver: Secondary | ICD-10-CM | POA: Diagnosis not present

## 2018-01-07 DIAGNOSIS — K222 Esophageal obstruction: Secondary | ICD-10-CM | POA: Diagnosis not present

## 2018-01-07 DIAGNOSIS — B182 Chronic viral hepatitis C: Secondary | ICD-10-CM | POA: Diagnosis not present

## 2018-01-07 DIAGNOSIS — Z8619 Personal history of other infectious and parasitic diseases: Secondary | ICD-10-CM

## 2018-01-07 MED ORDER — SODIUM CHLORIDE 0.9 % IV SOLN: 500.0000 mL | Freq: Once | INTRAVENOUS | Status: DC

## 2018-01-07 NOTE — Progress Notes (Signed)
Upon waking pt became argumentative that she did in fact want pt in here (after opting for HIPPA in admitting).  RN went to get Insurance underwriter.  Pt coming out of bed saying she was leaving without using a wheelchair.  Lanelle Bal arrived and took over situation.  Pt did infact ride out in wheelchair. Angela/ recovery [facility]

## 2018-01-07 NOTE — Op Note (Signed)
[facility] Patient Name: Judith Morgan Procedure Date: 01/07/2018 3:05 PM MRN: [id number] Endoscopist: Docia Chuck. Henrene Pastor , MD Age: 57 Referring MD:  Date of Birth: Sep 06, 1960 Gender: Female Account #: [id number] Procedure:                Colonoscopy, with cold snare polypectomy x 1 Indications:              Screening for colorectal malignant neoplasm Medicines:                Monitored Anesthesia Care Procedure:                Pre-Anesthesia Assessment:                           - Prior to the procedure, a History and Physical                            was performed, and patient medications and                            allergies were reviewed. The patient's tolerance of                            previous anesthesia was also reviewed. The risks                            and benefits of the procedure and the sedation                            options and risks were discussed with the patient.                            All questions were answered, and informed consent                            was obtained. Prior Anticoagulants: The patient has                            taken no previous anticoagulant or antiplatelet                            agents. ASA Grade Assessment: II - A patient with                            mild systemic disease. After reviewing the risks                            and benefits, the patient was deemed in                            satisfactory condition to undergo the procedure.                           After obtaining informed consent, the colonoscope  was passed under direct vision. Throughout the                            procedure, the patient's blood pressure, pulse, and                            oxygen saturations were monitored continuously. The                            Colonoscope was introduced through the anus and                            advanced to the the cecum, identified by     appendiceal orifice and ileocecal valve. The                            ileocecal valve, appendiceal orifice, and rectum                            were photographed. The quality of the bowel                            preparation was good. The colonoscopy was performed                            without difficulty. The patient tolerated the                            procedure well. The bowel preparation used was                            SUPREP. Scope In: 3:09:47 PM Scope Out: 3:23:31 PM Scope Withdrawal Time: 0 hours 10 minutes 54 seconds  Total Procedure Duration: 0 hours 13 minutes 44 seconds  Findings:                 A 1 mm polyp was found in the cecum. The polyp was                            removed with a cold snare. Resection and retrieval                            were complete.                           Multiple small and large-mouthed diverticula were                            found in the entire colon.                           The exam was otherwise without abnormality on                            direct and retroflexion views. Complications:  No immediate complications. Estimated blood loss:                            None. Estimated Blood Loss:     Estimated blood loss: none. Impression:               - One 1 mm polyp in the cecum, removed with a cold                            snare. Resected and retrieved.                           - Diverticulosis in the entire examined colon.                           - The examination was otherwise normal on direct                            and retroflexion views. Recommendation:           - Repeat colonoscopy in 5-10 years for surveillance.                           - Patient has a contact number available for                            emergencies. The signs and symptoms of potential                            delayed complications were discussed with the                            patient. Return to normal  activities tomorrow.                            Written discharge instructions were provided to the                            patient.                           - Resume previous diet.                           - Continue present medications.                           - Await pathology results. Docia Chuck. Henrene Pastor, MD 01/07/2018 3:27:15 PM This report has been signed electronically.

## 2018-01-07 NOTE — Patient Instructions (Signed)
*Handout given to patient on polyps and sealed in envelope because she is a HIPPA patient.  YOU HAD AN ENDOSCOPIC PROCEDURE TODAY AT [facility]:   Refer to the procedure report that was given to you for any specific questions about what was found during the examination.  If the procedure report does not answer your questions, please call your gastroenterologist to clarify.  If you requested that your care partner not be given the details of your procedure findings, then the procedure report has been included in a sealed envelope for you to review at your convenience later.  YOU SHOULD EXPECT: Some feelings of bloating in the abdomen. Passage of more gas than usual.  Walking can help get rid of the air that was put into your GI tract during the procedure and reduce the bloating. If you had a lower endoscopy (such as a colonoscopy or flexible sigmoidoscopy) you may notice spotting of blood in your stool or on the toilet paper. If you underwent a bowel prep for your procedure, you may not have a normal bowel movement for a few days.  Please Note:  You might notice some irritation and congestion in your nose or some drainage.  This is from the oxygen used during your procedure.  There is no need for concern and it should clear up in a day or so.  SYMPTOMS TO REPORT IMMEDIATELY:   Following lower endoscopy (colonoscopy or flexible sigmoidoscopy):  Excessive amounts of blood in the stool  Significant tenderness or worsening of abdominal pains  Swelling of the abdomen that is new, acute  Fever of 100F or higher   Following upper endoscopy (EGD)  Vomiting of blood or coffee ground material  New chest pain or pain under the shoulder blades  Painful or persistently difficult swallowing  New shortness of breath  Fever of 100F or higher  Black, tarry-looking stools  For urgent or emergent issues, a gastroenterologist can be reached at any hour by calling [phone].   DIET:   We do recommend a small meal at first, but then you may proceed to your regular diet.  Drink plenty of fluids but you should avoid alcoholic beverages for 24 hours.  ACTIVITY:  You should plan to take it easy for the rest of today and you should NOT DRIVE or use heavy machinery until tomorrow (because of the sedation medicines used during the test).    FOLLOW UP: Our staff will call the number listed on your records the next business day following your procedure to check on you and address any questions or concerns that you may have regarding the information given to you following your procedure. If we do not reach you, we will leave a message.  However, if you are feeling well and you are not experiencing any problems, there is no need to return our call.  We will assume that you have returned to your regular daily activities without incident.  If any biopsies were taken you will be contacted by phone or by letter within the next 1-3 weeks.  Please call us at [phone] if you have not heard about the biopsies in 3 weeks.    SIGNATURES/CONFIDENTIALITY: You and/or your care partner have signed paperwork which will be entered into your electronic medical record.  These signatures attest to the fact that that the information above on your After Visit Summary has been reviewed and is understood.  Full responsibility of the confidentiality of this discharge information lies  with you and/or your care-partner.

## 2018-01-07 NOTE — Op Note (Signed)
[facility] Patient Name: Judith Morgan Procedure Date: 01/07/2018 3:04 PM MRN: [id number] Endoscopist: Docia Chuck. Henrene Pastor , MD Age: 57 Referring MD:  Date of Birth: 01-10-1961 Gender: Female Account #: [id number] Procedure:                Upper GI endoscopy Indications:              Cirrhosis rule out esophageal varices Medicines:                Monitored Anesthesia Care Procedure:                Pre-Anesthesia Assessment:                           - Prior to the procedure, a History and Physical                            was performed, and patient medications and                            allergies were reviewed. The patient's tolerance of                            previous anesthesia was also reviewed. The risks                            and benefits of the procedure and the sedation                            options and risks were discussed with the patient.                            All questions were answered, and informed consent                            was obtained. Prior Anticoagulants: The patient has                            taken no previous anticoagulant or antiplatelet                            agents. ASA Grade Assessment: II - A patient with                            mild systemic disease. After reviewing the risks                            and benefits, the patient was deemed in                            satisfactory condition to undergo the procedure.                           After obtaining informed consent, the endoscope was  passed under direct vision. Throughout the                            procedure, the patient's blood pressure, pulse, and                            oxygen saturations were monitored continuously. The                            Endoscope was introduced through the mouth, and                            advanced to the second part of duodenum. The upper                            GI endoscopy was  accomplished without difficulty.                            The patient tolerated the procedure well. Scope In: Scope Out: Findings:                 A mild Schatzki ring was found at the                            gastroesophageal junction.                           The esophagus was otherwise normal. NO VARICES.                           The stomach was normal.                           The examined duodenum was normal.                           The cardia and gastric fundus were normal on                            retroflexion. Complications:            No immediate complications. Estimated Blood Loss:     Estimated blood loss: none. Impression:               1. Incidental esophageal ring                           2. Otherwise normal exam. NO VARICES. Recommendation:           - Patient has a contact number available for                            emergencies. The signs and symptoms of potential                            delayed complications were discussed with the  patient. Return to normal activities tomorrow.                            Written discharge instructions were provided to the                            patient.                           - Resume previous diet.                           - Continue present medications.                           - Repeat screening EGD in 2-3 years John N. Henrene Pastor, MD 01/07/2018 3:35:22 PM This report has been signed electronically.

## 2018-01-07 NOTE — Progress Notes (Signed)
Called to room to assist during endoscopic procedure.  Patient ID and intended procedure confirmed with present staff. Received instructions for my participation in the procedure from the performing physician.  

## 2018-01-07 NOTE — Progress Notes (Signed)
Report to PACU, RN, vss, BBS= Clear.  

## 2018-01-08 ENCOUNTER — Telehealth: Payer: Self-pay

## 2018-01-08 NOTE — Telephone Encounter (Signed)
  Follow up Call-  Call back number 01/07/2018  Post procedure Call Back phone  # [phone]  Permission to leave phone message Yes     Patient questions:  Do you have a fever, pain , or abdominal swelling? No. Pain Score  0 *  Have you tolerated food without any problems? Yes.    Have you been able to return to your normal activities? Yes.    Do you have any questions about your discharge instructions: Diet   No. Medications  No. Follow up visit  No.  Do you have questions or concerns about your Care? No.  Actions: * If pain score is 4 or above: No action needed, pain <4.

## 2018-01-14 ENCOUNTER — Encounter: Payer: Self-pay | Admitting: Internal Medicine

## 2018-05-14 ENCOUNTER — Telehealth: Payer: Self-pay

## 2018-05-14 NOTE — Telephone Encounter (Signed)
Left message

## 2018-12-29 IMAGING — US US ABDOMEN COMPLETE W/ ELASTOGRAPHY
2 series · 13 of 25 positions shown · non-contrast
Comparison: CT 08/15/2016

CLINICAL DATA: Chronic hepatitis-C



[Series 1: us abdomen complete w/ elastography · 0.25mm/px · 11 of 100 slices shown (1 of 2)]
[im 1/100]
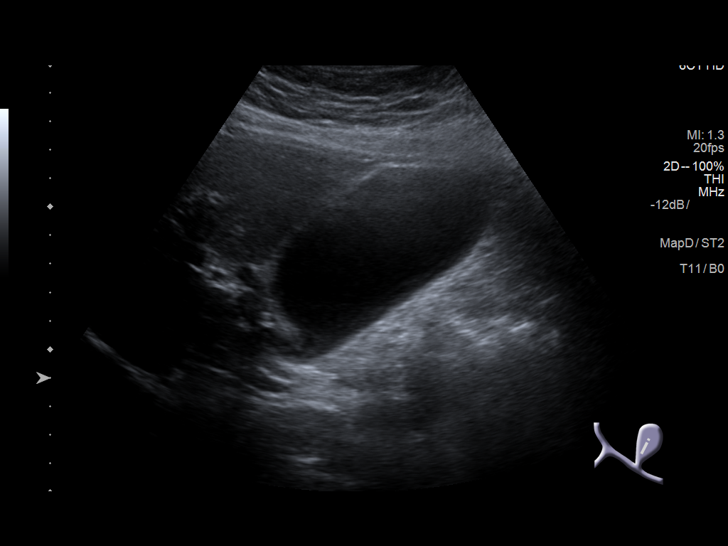
[im 10/100]
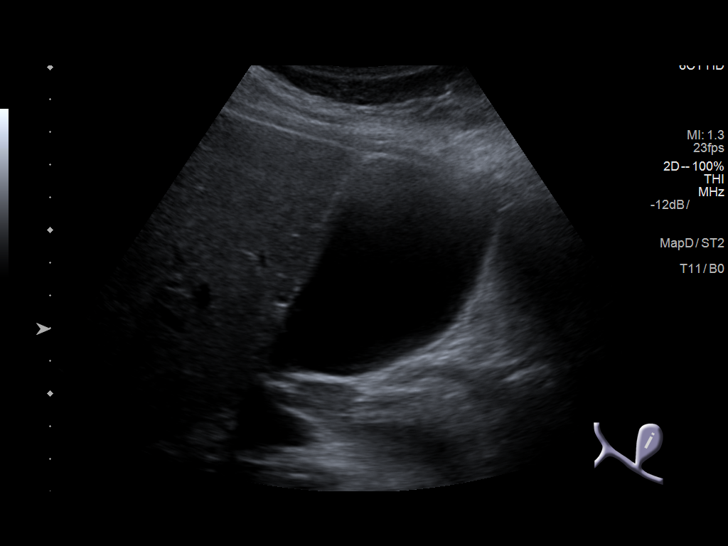
[im 19/100]
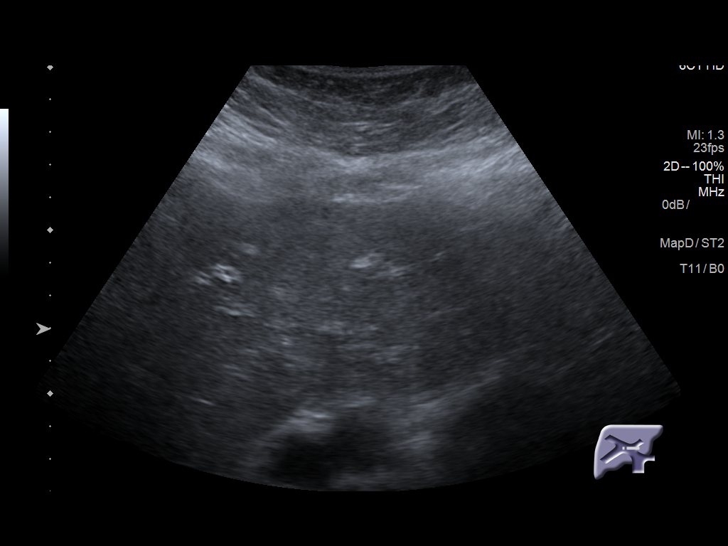
[im 29/100]
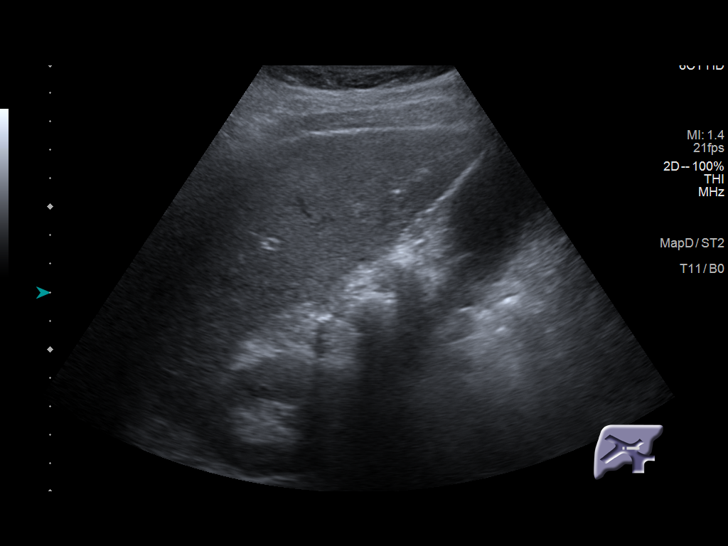
[im 38/100]
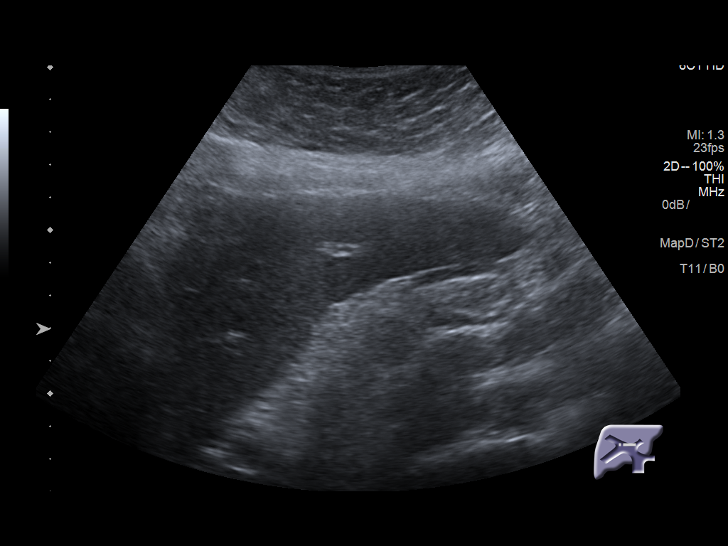
[im 48/100]
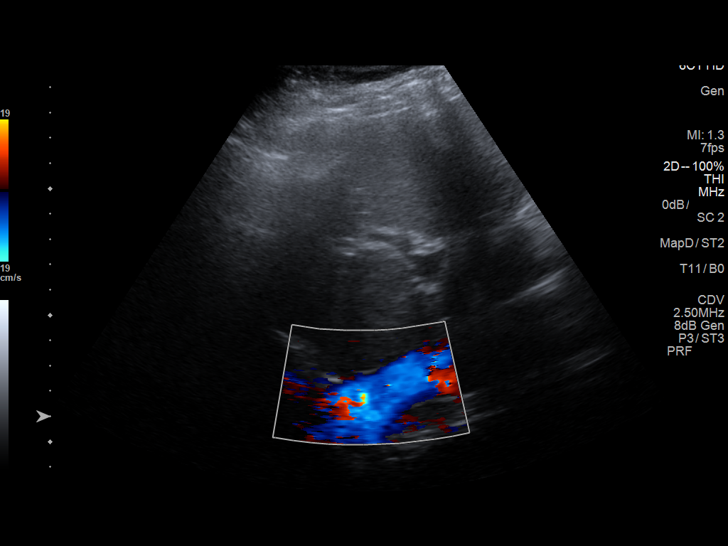
[im 57/100]
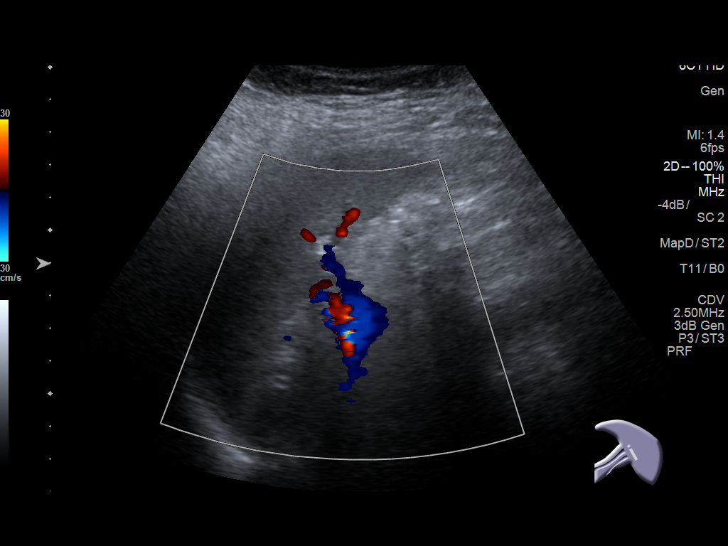
[im 67/100]
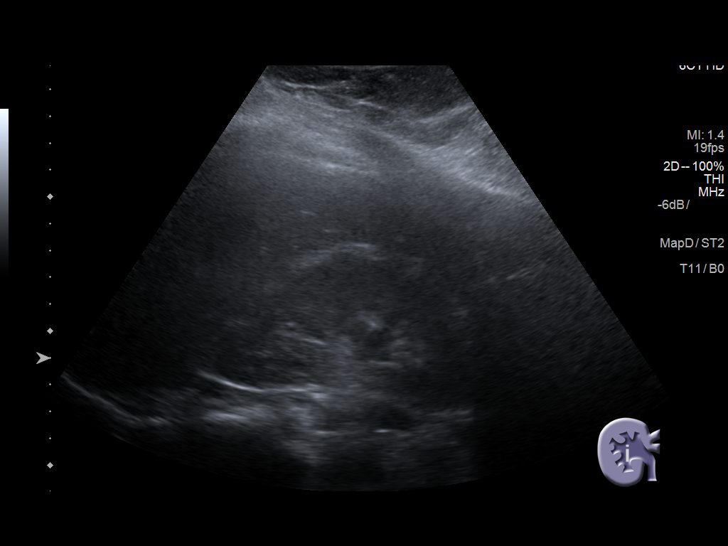
[im 76/100]
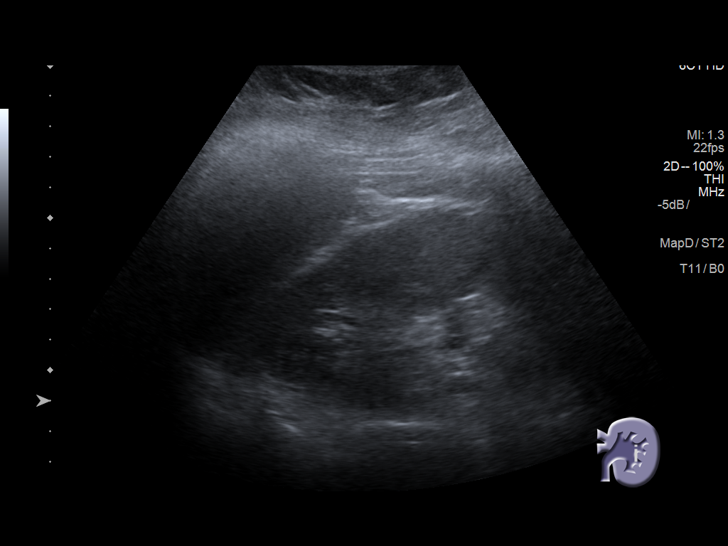
[im 85/100]
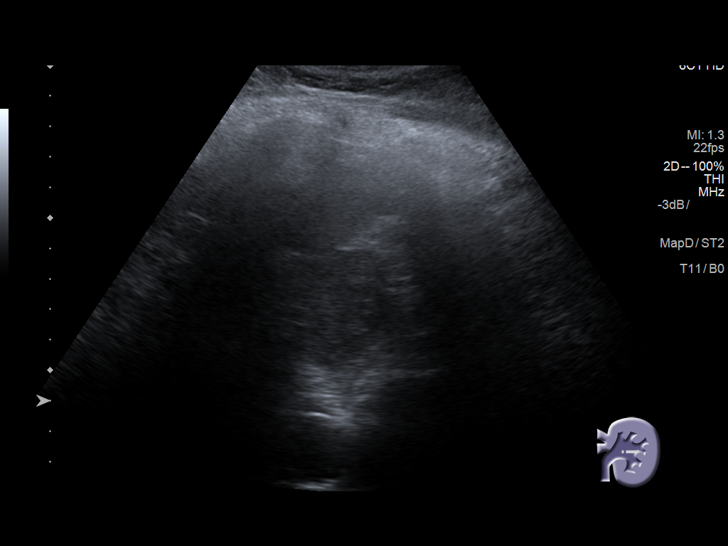
[im 95/100]
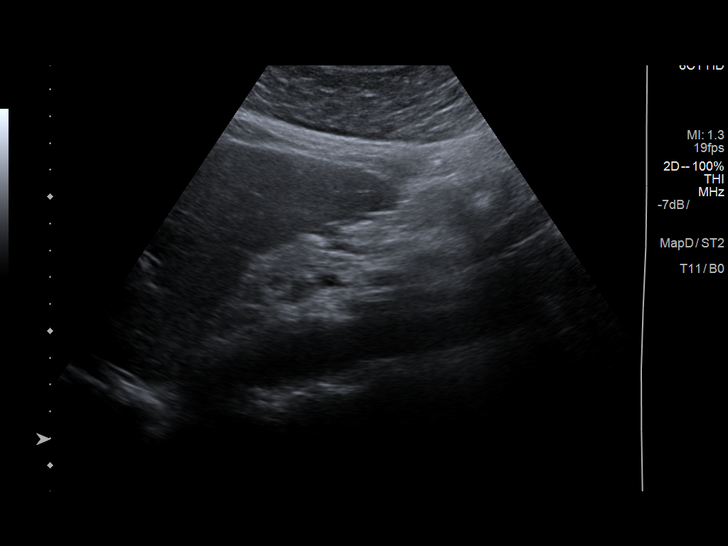

[Series 2001: us abdomen complete w/ elastography · 0.15mm/px · 2 of 12 slices shown (2 of 2)]
[im 1/12]
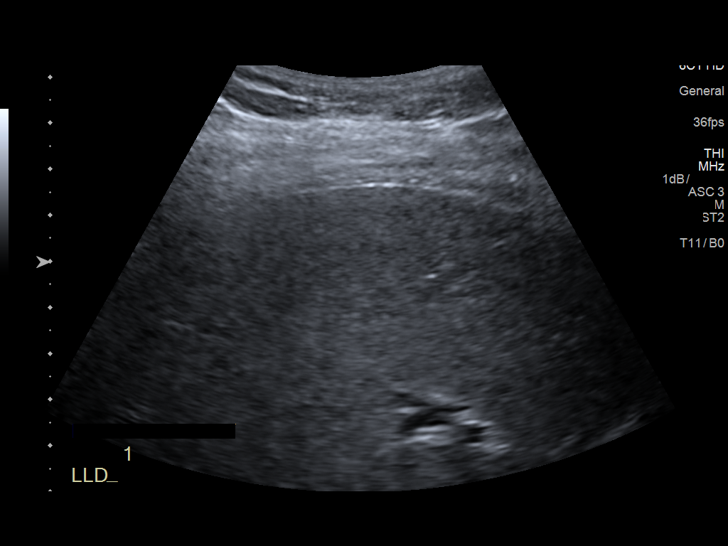
[im 12/12]
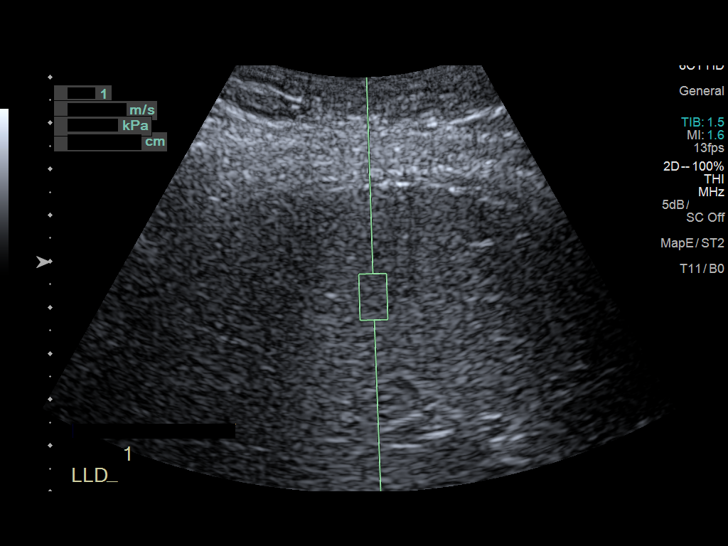

[13 of 25 positions shown; findings below may reference images not displayed]

FINDINGS: ULTRASOUND ABDOMEN

Gallbladder: No gallstones or wall thickening visualized. No
sonographic Murphy sign noted by sonographer.

Common bile duct: Diameter: Normal caliber, 4 mm

Liver: No focal lesion identified. Within normal limits in
parenchymal echogenicity.

IVC: No abnormality visualized.

Pancreas: Visualized portion unremarkable.

Spleen: Size and appearance within normal limits.

Right Kidney: Length: 10.8 cm. Echogenicity within normal limits. No
mass or hydronephrosis visualized.

Left Kidney: Length: 11.1 cm. Echogenicity within normal limits. No
mass or hydronephrosis visualized.

Abdominal aorta: No aneurysm visualized.

Other findings: None.

ULTRASOUND HEPATIC ELASTOGRAPHY

Device: Siemens Helix VTQ

Patient position: Left lateral decubitus

Transducer [id number]

Number of measurements: 10

Hepatic segment:  8

Median velocity:   2.74  m/sec

IQR:

IQR/Median velocity ratio:

Corresponding Metavir fibrosis score:  Some F3 + F4

Risk of fibrosis: High

Limitations of exam: None

Pertinent findings noted on other imaging exams:  None

Please note that abnormal shear wave velocities may also be
identified in clinical settings other than with hepatic fibrosis,
such as: acute hepatitis, elevated right heart and central venous
pressures including use of beta blockers, Modra disease
([place]), infiltrative processes such as
mastocytosis/amyloidosis/infiltrative tumor, extrahepatic
cholestasis, in the post-prandial state, and liver transplantation.
Correlation with patient history, laboratory data, and clinical
condition recommended.
IMPRESSION: ULTRASOUND ABDOMEN:
No acute abnormality sonographically

ULTRASOUND HEPATIC ELASTOGRAPHY:

Median hepatic shear wave velocity is calculated at 2.74 m/sec.

Corresponding Metavir fibrosis score is  Some F3 + F4.

Risk of fibrosis is High.

Follow-up: Follow up advised

## 2019-05-04 ENCOUNTER — Other Ambulatory Visit: Payer: Self-pay

## 2019-05-04 ENCOUNTER — Encounter (INDEPENDENT_AMBULATORY_CARE_PROVIDER_SITE_OTHER): Payer: Self-pay | Admitting: Bariatrics

## 2019-05-04 ENCOUNTER — Ambulatory Visit (INDEPENDENT_AMBULATORY_CARE_PROVIDER_SITE_OTHER): Payer: 59 | Admitting: Bariatrics

## 2019-05-04 VITALS — BP 109/74 | HR 80 | Temp 98.4°F | Ht 62.0 in | Wt 229.0 lb

## 2019-05-04 DIAGNOSIS — R0602 Shortness of breath: Secondary | ICD-10-CM

## 2019-05-04 DIAGNOSIS — F3289 Other specified depressive episodes: Secondary | ICD-10-CM

## 2019-05-04 DIAGNOSIS — Z6841 Body Mass Index (BMI) 40.0 and over, adult: Secondary | ICD-10-CM

## 2019-05-04 DIAGNOSIS — I10 Essential (primary) hypertension: Secondary | ICD-10-CM

## 2019-05-04 DIAGNOSIS — Z8619 Personal history of other infectious and parasitic diseases: Secondary | ICD-10-CM

## 2019-05-04 DIAGNOSIS — R7309 Other abnormal glucose: Secondary | ICD-10-CM | POA: Diagnosis not present

## 2019-05-04 DIAGNOSIS — E559 Vitamin D deficiency, unspecified: Secondary | ICD-10-CM | POA: Diagnosis not present

## 2019-05-04 DIAGNOSIS — R69 Illness, unspecified: Secondary | ICD-10-CM | POA: Diagnosis not present

## 2019-05-04 DIAGNOSIS — Z9189 Other specified personal risk factors, not elsewhere classified: Secondary | ICD-10-CM | POA: Diagnosis not present

## 2019-05-04 DIAGNOSIS — R5383 Other fatigue: Secondary | ICD-10-CM

## 2019-05-04 DIAGNOSIS — Z0289 Encounter for other administrative examinations: Secondary | ICD-10-CM

## 2019-05-04 DIAGNOSIS — E66813 Obesity, class 3: Secondary | ICD-10-CM

## 2019-05-04 NOTE — Progress Notes (Signed)
.  Office: [phone]  /  Fax: [phone]   HPI:   Chief Complaint: OBESITY  Judith Morgan (MR# [id number]) is a 58 y.o. female who presents on 05/04/2019 for obesity evaluation and treatment. Current BMI is Body mass index is 40.6 kg/m.Judith Morgan has struggled with obesity for years and has been unsuccessful in either losing weight or maintaining long term weight loss. Judith Morgan attended our information session and states she is currently in the action stage of change and ready to dedicate time achieving and maintaining a healthier weight.  Judith Morgan states she struggles with family and or coworkers weight loss sabotage she has been heavy most of her life she started gaining weight at 58 years of age her heaviest weight ever was 302 lbs. she considers herself a picky eater she craves sweets  she states she eats snacks before bed she skips meals frequently she occasionally drinks soda, juice and tea with sugar she sometimes makes poor food choices she has problems with excessive hunger  she frequently eats larger portions than normal  she occasionally eats until she is stuffed she struggles with emotional eating    Judith Morgan feels her energy is lower than it should be. This has worsened with weight gain and has not worsened recently. Judith Morgan admits to daytime somnolence and she admits to waking up still tired. Patient is at risk for obstructive sleep apnea. Patent has a history of symptoms of daytime Judith, morning Judith, morning headache and hypertension. Patient generally gets 5 to 8 hours of sleep per night, and states they generally have restful sleep. Snoring is not present, except when tired. Apneic episodes are present. Epworth Sleepiness Score is 7  Dyspnea on exertion Judith Morgan notes increasing shortness of breath with exercising and seems to be worsening over time with weight gain. She notes getting out of breath sooner with activity than she used to. This has not gotten  worse recently. Judith Morgan uses Breo (extreme rare use of inhaler). Judith Morgan denies orthopnea.  Hypertension [name] is a 58 y.o. female with hypertension. She is taking Metoprolol, Amlodipine and Spironolactone (3 agents). Judith Morgan is well controlled with medications. Judith Morgan denies chest pain. She is working weight loss to help control her blood pressure with the goal of decreasing her risk of heart attack and stroke.   At risk for cardiovascular disease Judith Morgan is at a higher than average risk for cardiovascular disease due to obesity and hypertension. She currently denies any chest pain.  History of Hepatitis C Judith Morgan was treated for hepatitis C and was cured (hepatic cirrhosis).  Elevated Glucose Judith Morgan has a history of some elevated blood glucose readings without a diagnosis of diabetes. She admits to polyphagia.  Vitamin D deficiency Judith Morgan has a diagnosis of vitamin D deficiency. She is currently taking vit D and denies nausea, vomiting or muscle weakness.  Depression with emotional eating behaviors Judith Morgan is struggling with emotional eating and using food for comfort to the extent that it is negatively impacting her health. She often snacks when she is not hungry. Judith Morgan sometimes feels she is out of control and then feels guilty that she made poor food choices. 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 She is attempting to work on behavior modification techniques to help reduce her emotional eating and has been somewhat successful. She shows no sign of suicidal or homicidal ideations.  Depression Screen Judith Morgan's Food and Mood (modified PHQ-9) score was  Depression screen [facility] 2/9 05/04/2019  Decreased  Interest 2  Down, Depressed, Hopeless 0  PHQ - 2 Score 2  Altered sleeping 1  Tired, decreased energy 2  Change in appetite 2  Feeling bad or failure about yourself  1  Trouble concentrating 0  Moving slowly or fidgety/restless 0  Suicidal  thoughts 0  PHQ-9 Score 8  Difficult doing work/chores Not difficult at all    ASSESSMENT AND PLAN:  Other Judith - Plan: EKG 12-Lead, Comprehensive metabolic panel, T3, T4, free, TSH  SOB (shortness of breath) on exertion - Plan: Lipid Panel With LDL/HDL Ratio  Essential hypertension  Elevated glucose - Plan: Hemoglobin A1c, Insulin, random  Vitamin D deficiency - Plan: VITAMIN D 25 Hydroxy (Vit-D Deficiency, Fractures)  Other depression - with emotional eating   History of hepatitis C  At risk for heart disease  Class 3 severe obesity with serious comorbidity and body mass index (BMI) of 40.0 to 44.9 in adult, unspecified obesity type (HCC)  PLAN:  Judith [name] was informed that her Judith may be related to obesity, depression or many other causes. Labs will be ordered, and in the meanwhile Judith Morgan has agreed to work on diet, exercise and weight loss to help with Judith. Proper sleep hygiene was discussed including the need for 7-8 hours of quality sleep each night. A sleep study was not ordered based on symptoms and Epworth score.  Dyspnea on exertion Judith Morgan's shortness of breath appears to be obesity related and exercise induced. She has agreed to work on weight loss and gradually increase exercise to treat her exercise induced shortness of breath. If Judith Morgan follows our instructions and loses weight without improvement of her shortness of breath, we will plan to refer to pulmonology. We will monitor this condition regularly. Judith Morgan agrees to this plan.  Hypertension We discussed sodium restriction, working on healthy weight loss, and a regular exercise program as the means to achieve improved blood pressure control. Judith Morgan agreed with this plan and agreed to follow up as directed. We will continue to monitor her blood pressure as well as her progress with the above lifestyle modifications. She will continue her medications as prescribed and will watch for signs of  hypotension as she continues her lifestyle modifications.  Cardiovascular risk counseling Judith Morgan was given extended (15 minutes) coronary artery disease prevention counseling today. She is 58 y.o. female and has risk factors for heart disease including obesity and hypertension. We discussed intensive lifestyle modifications today with an emphasis on specific weight loss instructions and strategies. Pt was also informed of the importance of increasing exercise and decreasing saturated fats to help prevent heart disease.  History of Hepatitis C Aviva will contact GI if needed. She will follow up with our clinic in 2 weeks.  Elevated Glucose Fasting labs will be obtained (Hgb A1c, insulin level) and results with be discussed with Shakeia in 2 weeks at her follow up visit. In the meanwhile Tallulah was started on a lower simple carbohydrate diet and will work on weight loss efforts.  Vitamin D Deficiency Quantia was informed that low vitamin D levels contributes to Judith and are associated with obesity, breast, and colon cancer. She will continue taking vitamin D. We will check vitamin D level and she will follow up for routine testing of vitamin D, at least 2-3 times per year. She was informed of the risk of over-replacement of vitamin D and agrees to not increase her dose unless she discusses this with [place] first.   Depression with Emotional Eating Behaviors We  discussed behavior modification techniques today to help Cynia deal with her emotional eating and depression. We will refer patient to Dr. Mallie Mussel our bariatric psychologist.  Depression Screen Sarya had a mildly positive depression screening. Depression is commonly associated with obesity and often results in emotional eating behaviors. We will monitor this closely and work on CBT to help improve the non-hunger eating patterns.  Obesity Kathleen is currently in the action stage of change and her goal is to continue with weight loss efforts  She has agreed to follow the Category 3 plan Mylasia has been instructed to work up to a goal of 150 minutes of combined cardio and strengthening exercise per week for weight loss and overall health benefits. We discussed the following Behavioral Modification Strategies today: increase H2O intake, no skipping meals, keeping healthy foods in the home, increasing lean protein intake, decreasing simple carbohydrates , increasing vegetables, decrease eating out and work on meal planning and intentional eating  Shiquita has agreed to follow up with our clinic in 2 weeks. She was informed of the importance of frequent follow up visits to maximize her success with intensive lifestyle modifications for her multiple health conditions. She was informed we would discuss her lab results at her next visit unless there is a critical issue that needs to be addressed sooner. Avyana agreed to keep her next visit at the agreed upon time to discuss these results.  ALLERGIES: Allergies  Allergen Reactions  . Norco [Hydrocodone-Acetaminophen] Nausea And Vomiting    Vicodin and that class of meds   . Other Itching    Red peppers    MEDICATIONS: Current Outpatient Medications on File Prior to Visit  Medication Sig Dispense Refill  . amLODipine (NORVASC) 10 MG tablet Take 10 mg by mouth daily.    Judith Kitchen aspirin 81 MG chewable tablet Chew by mouth daily.    . Cholecalciferol (VITAMIN D3) 50 MCG (2000 UT) capsule Take 2,000 Units by mouth daily.    . Magnesium 500 MG CAPS Take by mouth.    . metoprolol succinate (TOPROL-XL) 25 MG 24 hr tablet TAKE 1 TABLET BY MOUTH EVERY DAY FOR BLOOD PRESSURE  5  . Multiple Vitamin (MULTIVITAMIN WITH MINERALS) TABS tablet Take 1 tablet by mouth daily.    Judith Kitchen spironolactone (ALDACTONE) 25 MG tablet Take by mouth.    . traMADol (ULTRAM) 50 MG tablet Take 1 tablet (50 mg total) by mouth every 6 (six) hours as needed for moderate pain or severe pain. 20 tablet 0  . ibuprofen (ADVIL,MOTRIN)  200 MG tablet Take 800 mg by mouth every 6 (six) hours as needed for mild pain.     Judith Kitchen torsemide (DEMADEX) 5 MG tablet Take 5 mg by mouth as needed.      Current Facility-Administered Medications on File Prior to Visit  Medication Dose Route Frequency Provider Last Rate Last Dose  . 0.9 %  sodium chloride infusion  500 mL Intravenous Once Irene Shipper, MD        PAST MEDICAL HISTORY: Past Medical History:  Diagnosis Date  . Allergy   . Arthritis   . Asthma   . Back pain   . Food allergy   . GERD (gastroesophageal reflux disease)   . Hepatitis C   . Hypertension   . Knee pain   . Lactose intolerance   . Migraines   . Obesity   . Palpitations   . Vitamin D deficiency     PAST SURGICAL HISTORY: Past Surgical History:  Procedure Laterality Date  . ABDOMINAL HYSTERECTOMY  1997   left ovaries  . OVARIAN CYST REMOVAL Right 1992   ruptured cyst  . POLYDACTYLY RECONSTRUCTION      SOCIAL HISTORY: Social History   Tobacco Use  . Smoking status: Never Smoker  . Smokeless tobacco: Never Used  Substance Use Topics  . Alcohol use: No  . Drug use: No    FAMILY HISTORY: Family History  Problem Relation Age of Onset  . Diabetes Mother   . Heart disease Mother   . High blood pressure Mother   . High Cholesterol Mother   . Eating disorder Mother   . Obesity Mother   . Diabetes Father   . Prostate cancer Father   . Heart failure Father   . High blood pressure Father   . Sleep apnea Father   . Heart disease Father   . Dementia Maternal Grandmother   . Heart failure Paternal Grandmother   . Diabetes Maternal Aunt   . Stroke Maternal Aunt   . Hypertension Maternal Aunt     ROS: Review of Systems  Constitutional: Positive for malaise/Judith.  Eyes:       + Wear Glasses or Contacts  Respiratory: Positive for shortness of breath (with activity).   Cardiovascular: Negative for chest pain and orthopnea.  Gastrointestinal: Positive for heartburn. Negative for nausea  and vomiting.  Musculoskeletal:       Negative for muscle weakness  Endo/Heme/Allergies:       Positive for Hot or Cold Intolerance Positive for polyphagia  Psychiatric/Behavioral: Positive for depression. Negative for suicidal ideas.    PHYSICAL EXAM: Blood pressure 109/74, pulse 80, temperature 98.4 F (36.9 C), temperature source Oral, height 5\' 2"  (1.575 m), SpO2 97 %. Body mass index is 40.6 kg/m. Physical Exam Vitals signs reviewed.  Constitutional:      Appearance: Normal appearance. She is well-developed. She is obese.  HENT:     Head: Normocephalic and atraumatic.     Nose: Nose normal.  Eyes:     General: No scleral icterus.    Extraocular Movements: Extraocular movements intact.  Neck:     Musculoskeletal: Normal range of motion and neck supple.     Thyroid: No thyromegaly.  Cardiovascular:     Rate and Rhythm: Normal rate and regular rhythm.  Pulmonary:     Effort: Pulmonary effort is normal. No respiratory distress.  Abdominal:     Palpations: Abdomen is soft.     Tenderness: There is no abdominal tenderness.  Musculoskeletal: Normal range of motion.     Comments: Range of Motion normal in all 4 extremities  Skin:    General: Skin is warm and dry.     Comments: + Acanthosis Nigricans  Neurological:     Mental Status: She is alert and oriented to person, place, and time.     Coordination: Coordination normal.  Psychiatric:        Mood and Affect: Mood normal.        Behavior: Behavior normal.     RECENT LABS AND TESTS: BMET    Component Value Date/Time   NA 138 08/22/2017 0932   K 3.8 08/22/2017 0932   CL 103 08/22/2017 0932   CO2 26 08/22/2017 0932   GLUCOSE 107 (H) 08/22/2017 0932   BUN 11 08/22/2017 0932   CREATININE 0.65 08/22/2017 0932   CALCIUM 9.3 08/22/2017 0932   GFRNONAA 100 08/22/2017 0932   GFRAA 116 08/22/2017 0932   No results found for:  HGBA1C No results found for: INSULIN CBC    Component Value Date/Time   WBC 4.7  11/06/2016 1422   RBC 4.82 11/06/2016 1422   HGB 15.0 11/06/2016 1422   HCT 44.3 11/06/2016 1422   PLT 215 11/06/2016 1422   MCV 91.9 11/06/2016 1422   MCH 31.1 11/06/2016 1422   MCHC 33.9 11/06/2016 1422   RDW 13.8 11/06/2016 1422   LYMPHSABS 2,115 11/06/2016 1422   MONOABS 329 11/06/2016 1422   EOSABS 94 11/06/2016 1422   BASOSABS 0 11/06/2016 1422   Iron/TIBC/Ferritin/ %Sat No results found for: IRON, TIBC, FERRITIN, IRONPCTSAT Lipid Panel  No results found for: CHOL, TRIG, HDL, CHOLHDL, VLDL, LDLCALC, LDLDIRECT Hepatic Function Panel     Component Value Date/Time   PROT 6.9 08/22/2017 0932   ALBUMIN 4.0 04/11/2017 1223   AST 26 08/22/2017 0932   ALT 18 08/22/2017 0932   ALKPHOS 118 04/11/2017 1223   BILITOT 0.7 08/22/2017 0932   No results found for: TSH Vitamin D There are no recent lab results  ECG  shows NSR with a rate of 78 BPM INDIRECT CALORIMETER done today shows a VO2 of 263 and a REE of 1833. Her calculated basal metabolic rate is [id number] thus her basal metabolic rate is better than expected.       OBESITY BEHAVIORAL INTERVENTION VISIT  Today's visit was # 1   Starting weight: 229 lbs Starting date: 05/04/2019 Today's weight : 229 lbs Today's date: 05/04/2019 Total lbs lost to date: 0    05/04/2019  Height 5\' 2"  (1.575 m)  Weight 229 lb (103.9 kg)  BMI (Calculated) 41.87  BLOOD PRESSURE - SYSTOLIC 0000000  BLOOD PRESSURE - DIASTOLIC 74  Waist Measurement  44 inches   Body Fat % 49.5 %  Total Body Water (lbs) 81.4 lbs  RMR 1833    ASK: We discussed the diagnosis of obesity with Lizzett Linton Morgan today and Jessey agreed to give [place] permission to discuss obesity behavioral modification therapy today.  ASSESS: Ashonti has the diagnosis of obesity and her BMI today is 41.87 Kamariah is in the action stage of change   ADVISE: Layliana was educated on the multiple health risks of obesity as well as the benefit of weight loss to improve her health. She was  advised of the need for long term treatment and the importance of lifestyle modifications to improve her current health and to decrease her risk of future health problems.  AGREE: Multiple dietary modification options and treatment options were discussed and  Shambria agreed to follow the recommendations documented in the above note.  ARRANGE: Errica was educated on the importance of frequent visits to treat obesity as outlined per CMS and USPSTF guidelines and agreed to schedule her next follow up appointment today.   Corey Skains, am acting as Location manager for [employer]. Owens Shark, DO   I have reviewed the above documentation for accuracy and completeness, and I agree with the above. -Jearld Lesch, DO

## 2019-05-05 ENCOUNTER — Encounter (INDEPENDENT_AMBULATORY_CARE_PROVIDER_SITE_OTHER): Payer: Self-pay | Admitting: Bariatrics

## 2019-05-05 DIAGNOSIS — R7303 Prediabetes: Secondary | ICD-10-CM | POA: Insufficient documentation

## 2019-05-05 LAB — COMPREHENSIVE METABOLIC PANEL
ALT: 22 IU/L (ref 0–32)
AST: 22 IU/L (ref 0–40)
Albumin/Globulin Ratio: 1.2 (ref 1.2–2.2)
Albumin: 4.3 g/dL (ref 3.8–4.9)
Alkaline Phosphatase: 89 IU/L (ref 39–117)
BUN/Creatinine Ratio: 18 (ref 9–23)
BUN: 12 mg/dL (ref 6–24)
Bilirubin Total: 0.3 mg/dL (ref 0.0–1.2)
CO2: 23 mmol/L (ref 20–29)
Calcium: 9.4 mg/dL (ref 8.7–10.2)
Chloride: 102 mmol/L (ref 96–106)
Creatinine, Ser: 0.67 mg/dL (ref 0.57–1.00)
GFR calc Af Amer: 113 mL/min/{1.73_m2} (ref >59)
GFR calc non Af Amer: 98 mL/min/{1.73_m2} (ref >59)
Globulin, Total: 3.5 g/dL (ref 1.5–4.5)
Glucose: 107 mg/dL — ABNORMAL HIGH (ref 65–99)
Potassium: 4.3 mmol/L (ref 3.5–5.2)
Sodium: 140 mmol/L (ref 134–144)
Total Protein: 7.8 g/dL (ref 6.0–8.5)

## 2019-05-05 LAB — LIPID PANEL WITH LDL/HDL RATIO
Cholesterol, Total: 138 mg/dL (ref 100–199)
HDL: 48 mg/dL (ref >39)
LDL Chol Calc (NIH): 76 mg/dL (ref 0–99)
LDL/HDL Ratio: 1.6 ratio (ref 0.0–3.2)
Triglycerides: 71 mg/dL (ref 0–149)
VLDL Cholesterol Cal: 14 mg/dL (ref 5–40)

## 2019-05-05 LAB — T4, FREE: Free T4: 1.35 ng/dL (ref 0.82–1.77)

## 2019-05-05 LAB — HEMOGLOBIN A1C
Est. average glucose Bld gHb Est-mCnc: 123 mg/dL
Hgb A1c MFr Bld: 5.9 % — ABNORMAL HIGH (ref 4.8–5.6)

## 2019-05-05 LAB — INSULIN, RANDOM: INSULIN: 34.6 u[IU]/mL — ABNORMAL HIGH (ref 2.6–24.9)

## 2019-05-05 LAB — T3: T3, Total: 208 ng/dL — ABNORMAL HIGH (ref 71–180)

## 2019-05-05 LAB — TSH: TSH: 1.49 u[IU]/mL (ref 0.450–4.500)

## 2019-05-05 LAB — VITAMIN D 25 HYDROXY (VIT D DEFICIENCY, FRACTURES): Vit D, 25-Hydroxy: 32.7 ng/mL (ref 30.0–100.0)

## 2019-05-18 ENCOUNTER — Ambulatory Visit (INDEPENDENT_AMBULATORY_CARE_PROVIDER_SITE_OTHER): Payer: 59 | Admitting: Bariatrics

## 2019-05-18 ENCOUNTER — Other Ambulatory Visit: Payer: Self-pay

## 2019-05-18 VITALS — BP 112/75 | HR 82 | Temp 98.3°F | Ht 62.0 in | Wt 223.8 lb

## 2019-05-18 DIAGNOSIS — F3289 Other specified depressive episodes: Secondary | ICD-10-CM

## 2019-05-18 DIAGNOSIS — R7303 Prediabetes: Secondary | ICD-10-CM | POA: Diagnosis not present

## 2019-05-18 DIAGNOSIS — Z6841 Body Mass Index (BMI) 40.0 and over, adult: Secondary | ICD-10-CM

## 2019-05-18 DIAGNOSIS — I10 Essential (primary) hypertension: Secondary | ICD-10-CM

## 2019-05-18 DIAGNOSIS — Z9189 Other specified personal risk factors, not elsewhere classified: Secondary | ICD-10-CM | POA: Diagnosis not present

## 2019-05-18 DIAGNOSIS — E559 Vitamin D deficiency, unspecified: Secondary | ICD-10-CM | POA: Diagnosis not present

## 2019-05-18 MED ORDER — VITAMIN D (ERGOCALCIFEROL) 1.25 MG (50000 UNIT) PO CAPS: 50000.0000 [IU] | ORAL_CAPSULE | ORAL | 0 refills | Status: DC

## 2019-05-18 NOTE — Progress Notes (Signed)
Office: [phone]  /  Fax: [phone]   HPI:   Chief Complaint: OBESITY Judith Morgan is here to discuss her progress with her obesity treatment plan. She is on the Category 3 plan and is following her eating plan approximately 100 % of the time. She states she is exercising 0 minutes 0 times per week. Judith Morgan is down 6 lbs. She was unable to eat everything on the list. She could not drink the milk.  Her weight is 223 lb 12.8 oz (101.5 kg) today and has had a weight loss of 6 pounds over a period of 2 weeks since her last visit. She has lost 6 lbs since starting treatment with [place].  Hypertension Judith Morgan is a 58 y.o. female with hypertension. [name]'s blood pressure is well controlled. She denies chest pain. She is working on weight loss to help control her blood pressure with the goal of decreasing her risk of heart attack and stroke.   Vitamin D Deficiency Judith Morgan has a diagnosis of vitamin D deficiency. She is currently taking Vit D 2,000 IU daily. Last Vit D level was 32.7. She denies nausea, vomiting or muscle weakness.  Pre-Diabetes Judith Morgan has a diagnosis of pre-diabetes based on her elevated Hgb A1c and was informed this puts her at greater risk of developing diabetes. Last A1c was 5.9 and insulin of 34.6. She is not taking metformin currently and continues to work on diet and exercise to decrease risk of diabetes. She denies polyphagia or hypoglycemia.  At risk for diabetes Judith Morgan is at higher than average risk for developing diabetes due to her obesity and pre-diabetes. She currently denies polyuria or polydipsia.  Depression with Emotional Eating Behaviors Judith Morgan is struggling with emotional eating and using food for comfort to the extent that it is negatively impacting her health. She often snacks when she is not hungry. Judith Morgan sometimes feels she is out of control and then feels guilty that she made poor food choices. She has been working on behavior modification  techniques to help reduce her emotional eating and has been somewhat successful. She shows no sign of suicidal or homicidal ideations.  Depression screen [name] 2/9 05/04/2019 09/12/2017 12/12/2016  Decreased Interest 2 0 0  Down, Depressed, Hopeless 0 0 0  PHQ - 2 Score 2 0 0  Altered sleeping 1 - -  Tired, decreased energy 2 - -  Change in appetite 2 - -  Feeling bad or failure about yourself  1 - -  Trouble concentrating 0 - -  Moving slowly or fidgety/restless 0 - -  Suicidal thoughts 0 - -  PHQ-9 Score 8 - -  Difficult doing work/chores Not difficult at all - -    ASSESSMENT AND PLAN:  Essential hypertension  Other depression  Vitamin D deficiency - Plan: Vitamin D, Ergocalciferol, (DRISDOL) 1.25 MG (50000 UT) CAPS capsule  Prediabetes  At risk for diabetes mellitus  Class 3 severe obesity with serious comorbidity and body mass index (BMI) of 40.0 to 44.9 in adult, unspecified obesity type (HCC)  PLAN:  Hypertension We discussed sodium restriction, working on healthy weight loss, and a regular exercise program as the means to achieve improved blood pressure control. Judith Morgan agreed with this plan and agreed to follow up as directed. We will continue to monitor her blood pressure as well as her progress with the above lifestyle modifications. Judith Morgan agrees to continue her medications and will watch for signs of hypotension as she continues her lifestyle modifications. Judith Morgan agrees to  follow up with our clinic in 2 weeks.  Vitamin D Deficiency Judith Morgan was informed that low vitamin D levels contributes to fatigue and are associated with obesity, breast, and colon cancer. Judith Morgan agrees to start prescription Vit D 50,000 IU every week #4 with no refills. She will follow up for routine testing of vitamin D, at least 2-3 times per year. She was informed of the risk of over-replacement of vitamin D and agrees to not increase her dose unless she discusses this with [place] first. [name] agrees to  follow up with our clinic in 2 weeks.  Pre-Diabetes Judith Morgan will continue to work on weight loss, exercise, increase protein, and decreasing simple carbohydrates in her diet to help decrease the risk of diabetes. We dicussed metformin including benefits and risks. She was informed that eating too many simple carbohydrates or too many calories at one sitting increases the likelihood of GI side effects. Kabao agrees to follow up with [place] as directed to monitor her progress.  Diabetes risk counseling Judith Morgan was given extended (15 minutes) diabetes prevention counseling today. She is 58 y.o. female and has risk factors for diabetes including obesity and pre-diabetes. We discussed intensive lifestyle modifications today with an emphasis on weight loss as well as increasing exercise and decreasing simple carbohydrates in her diet.  Depression with Emotional Eating Behaviors We discussed cognitive behavioral techniques today to help Judith Morgan deal with her emotional eating and depression. Judith Morgan agrees to follow up with our clinic in 2 weeks.  Obesity Judith Morgan is currently in the action stage of change. As such, her goal is to continue with weight loss efforts She has agreed to follow the Category 2 plan or follow the Pescatarian eating plan Judith Morgan is to decrease calories to 1350. Judith Morgan has been instructed to work up to a goal of 150 minutes of combined cardio and strengthening exercise per week for weight loss and overall health benefits. We discussed the following Behavioral Modification Strategies today: increasing lean protein intake, decreasing simple carbohydrates, increasing vegetables, decrease eating out, increase H20 intake, no skipping meals, work on meal planning and easy cooking plans, keeping healthy foods in the home, and planning for success Handouts given: Smart Choices, Add Category 1 and 2 breakfast options.  Judith Morgan has agreed to follow up with our clinic in 2 weeks. She was informed of  the importance of frequent follow up visits to maximize her success with intensive lifestyle modifications for her multiple health conditions.  ALLERGIES: Allergies  Allergen Reactions  . Norco [Hydrocodone-Acetaminophen] Nausea And Vomiting    Vicodin and that class of meds   . Other Itching    Red peppers    MEDICATIONS: Current Outpatient Medications on File Prior to Visit  Medication Sig Dispense Refill  . amLODipine (NORVASC) 10 MG tablet Take 10 mg by mouth daily.    Marland Kitchen aspirin 81 MG chewable tablet Chew by mouth daily.    . Cholecalciferol (VITAMIN D3) 50 MCG (2000 UT) capsule Take 2,000 Units by mouth daily.    Marland Kitchen ibuprofen (ADVIL,MOTRIN) 200 MG tablet Take 800 mg by mouth every 6 (six) hours as needed for mild pain.     . Magnesium 500 MG CAPS Take by mouth.    . metoprolol succinate (TOPROL-XL) 25 MG 24 hr tablet TAKE 1 TABLET BY MOUTH EVERY DAY FOR BLOOD PRESSURE  5  . Multiple Vitamin (MULTIVITAMIN WITH MINERALS) TABS tablet Take 1 tablet by mouth daily.    Marland Kitchen spironolactone (ALDACTONE) 25 MG tablet Take by mouth.    Marland Kitchen  torsemide (DEMADEX) 5 MG tablet Take 5 mg by mouth as needed.     . traMADol (ULTRAM) 50 MG tablet Take 1 tablet (50 mg total) by mouth every 6 (six) hours as needed for moderate pain or severe pain. 20 tablet 0   Current Facility-Administered Medications on File Prior to Visit  Medication Dose Route Frequency Provider Last Rate Last Dose  . 0.9 %  sodium chloride infusion  500 mL Intravenous Once Irene Shipper, MD        PAST MEDICAL HISTORY: Past Medical History:  Diagnosis Date  . Allergy   . Arthritis   . Asthma   . Back pain   . Food allergy   . GERD (gastroesophageal reflux disease)   . Hepatitis C   . Hypertension   . Knee pain   . Lactose intolerance   . Migraines   . Obesity   . Palpitations   . Vitamin D deficiency     PAST SURGICAL HISTORY: Past Surgical History:  Procedure Laterality Date  . ABDOMINAL HYSTERECTOMY  1997   left  ovaries  . OVARIAN CYST REMOVAL Right 1992   ruptured cyst  . POLYDACTYLY RECONSTRUCTION      SOCIAL HISTORY: Social History   Tobacco Use  . Smoking status: Never Smoker  . Smokeless tobacco: Never Used  Substance Use Topics  . Alcohol use: No  . Drug use: No    FAMILY HISTORY: Family History  Problem Relation Age of Onset  . Diabetes Mother   . Heart disease Mother   . High blood pressure Mother   . High Cholesterol Mother   . Eating disorder Mother   . Obesity Mother   . Diabetes Father   . Prostate cancer Father   . Heart failure Father   . High blood pressure Father   . Sleep apnea Father   . Heart disease Father   . Dementia Maternal Grandmother   . Heart failure Paternal Grandmother   . Diabetes Maternal Aunt   . Stroke Maternal Aunt   . Hypertension Maternal Aunt     ROS: Review of Systems  Constitutional: Positive for weight loss.  Cardiovascular: Negative for chest pain.  Gastrointestinal: Negative for nausea and vomiting.  Genitourinary: Negative for frequency.  Musculoskeletal:       Negative muscle weakness  Endo/Heme/Allergies: Negative for polydipsia.       Negative polyphagia Negative hypoglycemia  Psychiatric/Behavioral: Positive for depression. Negative for suicidal ideas.    PHYSICAL EXAM: Blood pressure 112/75, pulse 82, temperature 98.3 F (36.8 C), temperature source Oral, height 5\' 2"  (1.575 m), weight 223 lb 12.8 oz (101.5 kg), SpO2 97 %. Body mass index is 40.93 kg/m. Physical Exam Vitals signs reviewed.  Constitutional:      Appearance: Normal appearance. She is obese.  Cardiovascular:     Rate and Rhythm: Normal rate.     Pulses: Normal pulses.  Pulmonary:     Effort: Pulmonary effort is normal.     Breath sounds: Normal breath sounds.  Musculoskeletal: Normal range of motion.  Skin:    General: Skin is warm and dry.  Neurological:     Mental Status: She is alert and oriented to person, place, and time.  Psychiatric:         Mood and Affect: Mood normal.        Behavior: Behavior normal.     RECENT LABS AND TESTS: BMET    Component Value Date/Time   NA 140 05/04/2019 1120  K 4.3 05/04/2019 1120   CL 102 05/04/2019 1120   CO2 23 05/04/2019 1120   GLUCOSE 107 (H) 05/04/2019 1120   GLUCOSE 107 (H) 08/22/2017 0932   BUN 12 05/04/2019 1120   CREATININE 0.67 05/04/2019 1120   CREATININE 0.65 08/22/2017 0932   CALCIUM 9.4 05/04/2019 1120   GFRNONAA 98 05/04/2019 1120   GFRNONAA 100 08/22/2017 0932   GFRAA 113 05/04/2019 1120   GFRAA 116 08/22/2017 0932   Lab Results  Component Value Date   HGBA1C 5.9 (H) 05/04/2019   Lab Results  Component Value Date   INSULIN 34.6 (H) 05/04/2019   CBC    Component Value Date/Time   WBC 4.7 11/06/2016 1422   RBC 4.82 11/06/2016 1422   HGB 15.0 11/06/2016 1422   HCT 44.3 11/06/2016 1422   PLT 215 11/06/2016 1422   MCV 91.9 11/06/2016 1422   MCH 31.1 11/06/2016 1422   MCHC 33.9 11/06/2016 1422   RDW 13.8 11/06/2016 1422   LYMPHSABS 2,115 11/06/2016 1422   MONOABS 329 11/06/2016 1422   EOSABS 94 11/06/2016 1422   BASOSABS 0 11/06/2016 1422   Iron/TIBC/Ferritin/ %Sat No results found for: IRON, TIBC, FERRITIN, IRONPCTSAT Lipid Panel     Component Value Date/Time   CHOL 138 05/04/2019 1120   TRIG 71 05/04/2019 1120   HDL 48 05/04/2019 1120   Hepatic Function Panel     Component Value Date/Time   PROT 7.8 05/04/2019 1120   ALBUMIN 4.3 05/04/2019 1120   AST 22 05/04/2019 1120   ALT 22 05/04/2019 1120   ALKPHOS 89 05/04/2019 1120   BILITOT 0.3 05/04/2019 1120      Component Value Date/Time   TSH 1.490 05/04/2019 1120      OBESITY BEHAVIORAL INTERVENTION VISIT  Today's visit was # 2   Starting weight: 229 lbs Starting date: 05/04/2019 Today's weight : 223 lbs Today's date: 05/18/2019 Total lbs lost to date: 6    ASK: We discussed the diagnosis of obesity with Jazsmine Linton Flemings today and Aishia agreed to give [place] permission to  discuss obesity behavioral modification therapy today.  ASSESS: Tijana has the diagnosis of obesity and her BMI today is 40.92 Erilyn is in the action stage of change   ADVISE: Loralei was educated on the multiple health risks of obesity as well as the benefit of weight loss to improve her health. She was advised of the need for long term treatment and the importance of lifestyle modifications to improve her current health and to decrease her risk of future health problems.  AGREE: Multiple dietary modification options and treatment options were discussed and  Madell agreed to follow the recommendations documented in the above note.  ARRANGE: Cigi was educated on the importance of frequent visits to treat obesity as outlined per CMS and USPSTF guidelines and agreed to schedule her next follow up appointment today.  Wilhemena Durie, am acting as transcriptionist for [employer], DO  I have reviewed the above documentation for accuracy and completeness, and I agree with the above. -Jearld Lesch, DO

## 2019-05-21 ENCOUNTER — Encounter (INDEPENDENT_AMBULATORY_CARE_PROVIDER_SITE_OTHER): Payer: Self-pay | Admitting: Bariatrics

## 2019-05-24 DIAGNOSIS — Z23 Encounter for immunization: Secondary | ICD-10-CM | POA: Diagnosis not present

## 2019-06-01 ENCOUNTER — Ambulatory Visit (INDEPENDENT_AMBULATORY_CARE_PROVIDER_SITE_OTHER): Payer: 59 | Admitting: Bariatrics

## 2019-06-04 ENCOUNTER — Other Ambulatory Visit: Payer: Self-pay

## 2019-06-04 ENCOUNTER — Ambulatory Visit (INDEPENDENT_AMBULATORY_CARE_PROVIDER_SITE_OTHER): Payer: 59 | Admitting: Family Medicine

## 2019-06-04 ENCOUNTER — Encounter (INDEPENDENT_AMBULATORY_CARE_PROVIDER_SITE_OTHER): Payer: Self-pay | Admitting: Family Medicine

## 2019-06-04 VITALS — BP 114/77 | HR 73 | Ht 62.0 in | Wt 222.0 lb

## 2019-06-04 DIAGNOSIS — Z6841 Body Mass Index (BMI) 40.0 and over, adult: Secondary | ICD-10-CM

## 2019-06-04 DIAGNOSIS — E559 Vitamin D deficiency, unspecified: Secondary | ICD-10-CM | POA: Insufficient documentation

## 2019-06-04 DIAGNOSIS — R7303 Prediabetes: Secondary | ICD-10-CM

## 2019-06-04 DIAGNOSIS — Z9189 Other specified personal risk factors, not elsewhere classified: Secondary | ICD-10-CM | POA: Diagnosis not present

## 2019-06-04 MED ORDER — VITAMIN D (ERGOCALCIFEROL) 1.25 MG (50000 UNIT) PO CAPS: 50000.0000 [IU] | ORAL_CAPSULE | ORAL | 0 refills | Status: DC

## 2019-06-04 NOTE — Progress Notes (Signed)
Office: [phone]  /  Fax: [phone]   HPI:   Chief Complaint: OBESITY Judith Morgan is here to discuss her progress with her obesity treatment plan. She is on the Category 2 plan + 150 calories and is following her eating plan approximately 66-100% of the time. She states she is exercising 0 minutes 0 times per week. Judith Morgan struggles to get all of the food in on her plan and is not getting all of her protein in. She does not get in the 350 extra calories. She reports she sometimes misses meals because she is so busy at work. Her weight is 222 lb (100.7 kg) today and has had a weight loss of 1 pound over a period of 2 weeks since her last visit. She has lost 7 lbs since starting treatment with [place].  Pre-Diabetes Judith Morgan has a diagnosis of prediabetes based on her elevated Hgb A1c and was informed this puts her at greater risk of developing diabetes. Last A1c was 5.9 on 05/04/2019. She states CBG's are less than 100. Judith Morgan is not taking metformin currently and continues to work on diet and exercise to decrease risk of diabetes. She denies nausea or hypoglycemia. No polyphagia.  Vitamin D deficiency Judith Morgan has a diagnosis of Vitamin D deficiency, which is not at goal. Last Vitamin D was 32.7 on 05/04/2019. She is currently taking prescription Vit D and denies nausea, vomiting or muscle weakness.  At risk for osteopenia and osteoporosis Judith Morgan is at higher risk of osteopenia and osteoporosis due to Vitamin D deficiency.   ASSESSMENT AND PLAN:  Prediabetes  Vitamin D deficiency - Plan: Vitamin D, Ergocalciferol, (DRISDOL) 1.25 MG (50000 UT) CAPS capsule  At risk for osteoporosis  Class 3 severe obesity with serious comorbidity and body mass index (BMI) of 40.0 to 44.9 in adult, unspecified obesity type ([place])  PLAN:  Pre-Diabetes Judith Morgan will continue to work on weight loss, exercise, and decreasing simple carbohydrates in her diet to help decrease the risk of diabetes. We dicussed  metformin including benefits and risks. She was informed that eating too many simple carbohydrates or too many calories at one sitting increases the likelihood of GI side effects. Judith Morgan will continue her meal plan and follow-up with [place] as directed to monitor her progress.  Vitamin D Deficiency Judith Morgan was informed that low Vitamin D levels contributes to fatigue and are associated with obesity, breast, and colon cancer. She agrees to continue to take prescription Vit D @ 50,000 IU every week #4 with 0 refills and will follow-up for routine testing of Vitamin D, at least 2-3 times per year. She was informed of the risk of over-replacement of Vitamin D and agrees to not increase her dose unless she discusses this with [place] first. Judith Morgan agrees to follow-up with our clinic in 2 weeks.  At risk for osteopenia and osteoporosis Judith Morgan was given extended  (15 minutes) osteoporosis prevention counseling today. Judith Morgan is at risk for osteopenia and osteoporosis due to her Vitamin D deficiency. She was encouraged to take her Vitamin D and follow her higher calcium diet and increase strengthening exercise to help strengthen her bones and decrease her risk of osteopenia and osteoporosis.  Obesity Judith Morgan is currently in the action stage of change. As such, her goal is to continue with weight loss efforts. She has agreed to follow the Category 2 plan. Judith Morgan will decrease calories to 1200 per day. She had been on Category 2 + 150 extra calories but was unable to eat ll of  the extra calories. . Judith Morgan has not been prescribed exercise at this time. We discussed the following Behavioral Modification Strategies today: increasing lean protein intake, increase H20 intake, no skipping meals, and planning for success.  Judith Morgan has agreed to follow-up with our clinic in 2 weeks. She was informed of the importance of frequent follow-up visits to maximize her success with intensive lifestyle modifications for her multiple  health conditions.  ALLERGIES: Allergies  Allergen Reactions  . Norco [Hydrocodone-Acetaminophen] Nausea And Vomiting    Vicodin and that class of meds   . Other Itching    Red peppers    MEDICATIONS: Current Outpatient Medications on File Prior to Visit  Medication Sig Dispense Refill  . amLODipine (NORVASC) 10 MG tablet Take 10 mg by mouth daily.    Marland Kitchen aspirin 81 MG chewable tablet Chew by mouth daily.    . Cholecalciferol (VITAMIN D3) 50 MCG (2000 UT) capsule Take 2,000 Units by mouth daily.    Marland Kitchen ibuprofen (ADVIL,MOTRIN) 200 MG tablet Take 800 mg by mouth every 6 (six) hours as needed for mild pain.     . Magnesium 500 MG CAPS Take by mouth.    . metoprolol succinate (TOPROL-XL) 25 MG 24 hr tablet TAKE 1 TABLET BY MOUTH EVERY DAY FOR BLOOD PRESSURE  5  . Multiple Vitamin (MULTIVITAMIN WITH MINERALS) TABS tablet Take 1 tablet by mouth daily.    Marland Kitchen spironolactone (ALDACTONE) 25 MG tablet Take by mouth.    . torsemide (DEMADEX) 5 MG tablet Take 5 mg by mouth as needed.     . traMADol (ULTRAM) 50 MG tablet Take 1 tablet (50 mg total) by mouth every 6 (six) hours as needed for moderate pain or severe pain. 20 tablet 0   Current Facility-Administered Medications on File Prior to Visit  Medication Dose Route Frequency Provider Last Rate Last Dose  . 0.9 %  sodium chloride infusion  500 mL Intravenous Once Irene Shipper, MD        PAST MEDICAL HISTORY: Past Medical History:  Diagnosis Date  . Allergy   . Arthritis   . Asthma   . Back pain   . Food allergy   . GERD (gastroesophageal reflux disease)   . Hepatitis C   . Hypertension   . Knee pain   . Lactose intolerance   . Migraines   . Obesity   . Palpitations   . Vitamin D deficiency     PAST SURGICAL HISTORY: Past Surgical History:  Procedure Laterality Date  . ABDOMINAL HYSTERECTOMY  1997   left ovaries  . OVARIAN CYST REMOVAL Right 1992   ruptured cyst  . POLYDACTYLY RECONSTRUCTION      SOCIAL HISTORY: Social  History   Tobacco Use  . Smoking status: Never Smoker  . Smokeless tobacco: Never Used  Substance Use Topics  . Alcohol use: No  . Drug use: No    FAMILY HISTORY: Family History  Problem Relation Age of Onset  . Diabetes Mother   . Heart disease Mother   . High blood pressure Mother   . High Cholesterol Mother   . Eating disorder Mother   . Obesity Mother   . Diabetes Father   . Prostate cancer Father   . Heart failure Father   . High blood pressure Father   . Sleep apnea Father   . Heart disease Father   . Dementia Maternal Grandmother   . Heart failure Paternal Grandmother   . Diabetes Maternal Aunt   .  Stroke Maternal Aunt   . Hypertension Maternal Aunt    ROS: Review of Systems  Constitutional: Positive for malaise/fatigue.  Gastrointestinal: Negative for nausea and vomiting.  Musculoskeletal:       Negative for muscle weakness.  Endo/Heme/Allergies:       Negative for hypoglycemia. Negative for polyphagia.   PHYSICAL EXAM: Blood pressure 114/77, pulse 73, height 5\' 2"  (1.575 m), weight 222 lb (100.7 kg), SpO2 97 %. Body mass index is 40.6 kg/m. Physical Exam Vitals signs reviewed.  Constitutional:      Appearance: Normal appearance. She is obese.  Cardiovascular:     Rate and Rhythm: Normal rate.     Pulses: Normal pulses.  Pulmonary:     Effort: Pulmonary effort is normal.     Breath sounds: Normal breath sounds.  Musculoskeletal: Normal range of motion.  Skin:    General: Skin is warm and dry.  Neurological:     Mental Status: She is alert and oriented to person, place, and time.  Psychiatric:        Behavior: Behavior normal.   RECENT LABS AND TESTS: BMET    Component Value Date/Time   NA 140 05/04/2019 1120   K 4.3 05/04/2019 1120   CL 102 05/04/2019 1120   CO2 23 05/04/2019 1120   GLUCOSE 107 (H) 05/04/2019 1120   GLUCOSE 107 (H) 08/22/2017 0932   BUN 12 05/04/2019 1120   CREATININE 0.67 05/04/2019 1120   CREATININE 0.65 08/22/2017  0932   CALCIUM 9.4 05/04/2019 1120   GFRNONAA 98 05/04/2019 1120   GFRNONAA 100 08/22/2017 0932   GFRAA 113 05/04/2019 1120   GFRAA 116 08/22/2017 0932   Lab Results  Component Value Date   HGBA1C 5.9 (H) 05/04/2019   Lab Results  Component Value Date   INSULIN 34.6 (H) 05/04/2019   CBC    Component Value Date/Time   WBC 4.7 11/06/2016 1422   RBC 4.82 11/06/2016 1422   HGB 15.0 11/06/2016 1422   HCT 44.3 11/06/2016 1422   PLT 215 11/06/2016 1422   MCV 91.9 11/06/2016 1422   MCH 31.1 11/06/2016 1422   MCHC 33.9 11/06/2016 1422   RDW 13.8 11/06/2016 1422   LYMPHSABS 2,115 11/06/2016 1422   MONOABS 329 11/06/2016 1422   EOSABS 94 11/06/2016 1422   BASOSABS 0 11/06/2016 1422   Iron/TIBC/Ferritin/ %Sat No results found for: IRON, TIBC, FERRITIN, IRONPCTSAT Lipid Panel     Component Value Date/Time   CHOL 138 05/04/2019 1120   TRIG 71 05/04/2019 1120   HDL 48 05/04/2019 1120   LDLCALC 76 05/04/2019 1120   Hepatic Function Panel     Component Value Date/Time   PROT 7.8 05/04/2019 1120   ALBUMIN 4.3 05/04/2019 1120   AST 22 05/04/2019 1120   ALT 22 05/04/2019 1120   ALKPHOS 89 05/04/2019 1120   BILITOT 0.3 05/04/2019 1120      Component Value Date/Time   TSH 1.490 05/04/2019 1120   Results for LEKESHIA, HUESTIS (MRN [id number]) as of 06/04/2019 10:30  Ref. Range 05/04/2019 11:20  Vitamin D, 25-Hydroxy Latest Ref Range: 30.0 - 100.0 ng/mL 32.7   OBESITY BEHAVIORAL INTERVENTION VISIT  Today's visit was #3  Starting weight: 229 lbs Starting date: 05/04/2019 Today's weight: 222 lbs  Today's date: 06/04/2019 Total lbs lost to date: 7    06/04/2019  Height 5\' 2"  (1.575 m)  Weight 222 lb (100.7 kg)  BMI (Calculated) 40.59  BLOOD PRESSURE - SYSTOLIC [id number]  BLOOD PRESSURE -  DIASTOLIC 77   Body Fat % [id number] %  Total Body Water (lbs) 80.8 lbs   ASK: We discussed the diagnosis of obesity with Judith Morgan today and Judith Morgan agreed to give [place] permission to  discuss obesity behavioral modification therapy today.  ASSESS: Judith Morgan has the diagnosis of obesity and her BMI today is 40.6. Kamila is in the action stage of change.   ADVISE: Judith Morgan was educated on the multiple health risks of obesity as well as the benefit of weight loss to improve her health. She was advised of the need for long term treatment and the importance of lifestyle modifications to improve her current health and to decrease her risk of future health problems.  AGREE: Multiple dietary modification options and treatment options were discussed and  Judith Morgan agreed to follow the recommendations documented in the above note.  ARRANGE: Judith Morgan was educated on the importance of frequent visits to treat obesity as outlined per CMS and USPSTF guidelines and agreed to schedule her next follow up appointment today.  IMichaelene Song, am acting as Location manager for [employer], FNP  I have reviewed the above documentation for accuracy and completeness, and I agree with the above.  - Turki Tapanes, FNP-C.

## 2019-06-07 ENCOUNTER — Other Ambulatory Visit (INDEPENDENT_AMBULATORY_CARE_PROVIDER_SITE_OTHER): Payer: Self-pay | Admitting: Bariatrics

## 2019-06-07 DIAGNOSIS — E559 Vitamin D deficiency, unspecified: Secondary | ICD-10-CM

## 2019-06-17 ENCOUNTER — Ambulatory Visit (INDEPENDENT_AMBULATORY_CARE_PROVIDER_SITE_OTHER): Payer: 59 | Admitting: Bariatrics

## 2019-06-17 ENCOUNTER — Other Ambulatory Visit: Payer: Self-pay

## 2019-06-17 VITALS — BP 108/71 | HR 95 | Temp 98.7°F | Ht 62.0 in | Wt 219.0 lb

## 2019-06-17 DIAGNOSIS — E559 Vitamin D deficiency, unspecified: Secondary | ICD-10-CM | POA: Diagnosis not present

## 2019-06-17 DIAGNOSIS — Z6841 Body Mass Index (BMI) 40.0 and over, adult: Secondary | ICD-10-CM | POA: Diagnosis not present

## 2019-06-17 DIAGNOSIS — R7303 Prediabetes: Secondary | ICD-10-CM | POA: Diagnosis not present

## 2019-06-22 ENCOUNTER — Encounter (INDEPENDENT_AMBULATORY_CARE_PROVIDER_SITE_OTHER): Payer: Self-pay | Admitting: Bariatrics

## 2019-06-22 NOTE — Progress Notes (Signed)
Office: [phone]  /  Fax: [phone]   HPI:   Chief Complaint: OBESITY Judith Morgan is here to discuss her progress with her obesity treatment plan. She is on the Category 2 plan and the Category 3 plan and is following her eating plan approximately 100% of the time. She states she is walking indoors 45 minutes 3 times per week. Judith Morgan is down 3 lbs. She states that she needs to stay on Category 3 to satisfy her appetite.  Her weight is 219 lb (99.3 kg) today and has had a weight loss of 3 pounds over a period of 2 weeks since her last visit. She has lost 10 lbs since starting treatment with [place].  Pre-Diabetes Judith Morgan has a diagnosis of prediabetes based on her elevated Hgb A1c and was informed this puts her at greater risk of developing diabetes. Last A1c 5.9 on 05/04/2019 with an insulin of 34.6. She is not taking metformin currently and continues to work on diet and exercise to decrease risk of diabetes. She denies nausea or hypoglycemia but does report polyphagia.  Vitamin D deficiency Judith Morgan has a diagnosis of Vitamin D deficiency. Last Vitamin D 32.7 on 05/04/2019. She is currently taking Vit D and denies nausea, vomiting or muscle weakness.  ASSESSMENT AND PLAN:  Prediabetes  Vitamin D deficiency  Class 3 severe obesity with serious comorbidity and body mass index (BMI) of 40.0 to 44.9 in adult, unspecified obesity type ([place])  PLAN:  Pre-Diabetes Judith Morgan will continue to work on weight loss, exercise, and decreasing simple carbohydrates in her diet to help decrease the risk of diabetes. We dicussed metformin including benefits and risks. She was informed that eating too many simple carbohydrates or too many calories at one sitting increases the likelihood of GI side effects. Judith Morgan was instructed to decrease carbohydrates, increase protein, and will follow the Category 3 plan. She will follow-up with [place] as directed to monitor her progress.  Vitamin D Deficiency Judith Morgan was  informed that low Vitamin D levels contributes to fatigue and are associated with obesity, breast, and colon cancer. She agrees to continue taking Vit D and will follow-up for routine testing of Vitamin D, at least 2-3 times per year. She was informed of the risk of over-replacement of Vitamin D and agrees to not increase her dose unless she discusses this with [place] first. Judith Morgan agrees to follow-up with our clinic in 2 weeks.  I spent > than 50% of the 15 minute visit on counseling as documented in the note.  Obesity Judith Morgan is currently in the action stage of change. As such, her goal is to continue with weight loss efforts. She has agreed to follow the Category 3 plan alternating with the Category 2 plan. Judith Morgan will work on meal planning, intentional eating, and increasing her water intake. Judith Morgan has been instructed to continue walking videos and Pilates exercise for weight loss and overall health benefits. We discussed the following Behavioral Modification Stratagies today: increasing lean protein intake, decreasing simple carbohydrates, increasing vegetables, increase H20 intake, decrease eating out, no skipping meals, work on meal planning and easy cooking plans, and keeping healthy foods in the home.  Judith Morgan has agreed to follow-up with our clinic in 2 weeks. She was informed of the importance of frequent follow-up visits to maximize her success with intensive lifestyle modifications for her multiple health conditions.  ALLERGIES: Allergies  Allergen Reactions  . Norco [Hydrocodone-Acetaminophen] Nausea And Vomiting    Vicodin and that class of meds   .  Other Itching    Red peppers    MEDICATIONS: Current Outpatient Medications on File Prior to Visit  Medication Sig Dispense Refill  . amLODipine (NORVASC) 10 MG tablet Take 10 mg by mouth daily.    Marland Kitchen aspirin 81 MG chewable tablet Chew by mouth daily.    . Cholecalciferol (VITAMIN D3) 50 MCG (2000 UT) capsule Take 2,000 Units by  mouth daily.    Marland Kitchen ibuprofen (ADVIL,MOTRIN) 200 MG tablet Take 800 mg by mouth every 6 (six) hours as needed for mild pain.     . Magnesium 500 MG CAPS Take by mouth.    . metoprolol succinate (TOPROL-XL) 25 MG 24 hr tablet TAKE 1 TABLET BY MOUTH EVERY DAY FOR BLOOD PRESSURE  5  . Multiple Vitamin (MULTIVITAMIN WITH MINERALS) TABS tablet Take 1 tablet by mouth daily.    Marland Kitchen spironolactone (ALDACTONE) 25 MG tablet Take by mouth.    . torsemide (DEMADEX) 5 MG tablet Take 5 mg by mouth as needed.     . traMADol (ULTRAM) 50 MG tablet Take 1 tablet (50 mg total) by mouth every 6 (six) hours as needed for moderate pain or severe pain. 20 tablet 0  . Vitamin D, Ergocalciferol, (DRISDOL) 1.25 MG (50000 UT) CAPS capsule Take 1 capsule (50,000 Units total) by mouth every 7 (seven) days. 4 capsule 0   Current Facility-Administered Medications on File Prior to Visit  Medication Dose Route Frequency Provider Last Rate Last Dose  . 0.9 %  sodium chloride infusion  500 mL Intravenous Once Irene Shipper, MD        PAST MEDICAL HISTORY: Past Medical History:  Diagnosis Date  . Allergy   . Arthritis   . Asthma   . Back pain   . Food allergy   . GERD (gastroesophageal reflux disease)   . Hepatitis C   . Hypertension   . Knee pain   . Lactose intolerance   . Migraines   . Obesity   . Palpitations   . Vitamin D deficiency     PAST SURGICAL HISTORY: Past Surgical History:  Procedure Laterality Date  . ABDOMINAL HYSTERECTOMY  1997   left ovaries  . OVARIAN CYST REMOVAL Right 1992   ruptured cyst  . POLYDACTYLY RECONSTRUCTION      SOCIAL HISTORY: Social History   Tobacco Use  . Smoking status: Never Smoker  . Smokeless tobacco: Never Used  Substance Use Topics  . Alcohol use: No  . Drug use: No    FAMILY HISTORY: Family History  Problem Relation Age of Onset  . Diabetes Mother   . Heart disease Mother   . High blood pressure Mother   . High Cholesterol Mother   . Eating disorder  Mother   . Obesity Mother   . Diabetes Father   . Prostate cancer Father   . Heart failure Father   . High blood pressure Father   . Sleep apnea Father   . Heart disease Father   . Dementia Maternal Grandmother   . Heart failure Paternal Grandmother   . Diabetes Maternal Aunt   . Stroke Maternal Aunt   . Hypertension Maternal Aunt    ROS: Review of Systems  Gastrointestinal: Negative for nausea and vomiting.  Musculoskeletal:       Negative for muscle weakness.  Endo/Heme/Allergies:       Negative for hypoglycemia. Positive for polyphagia.   PHYSICAL EXAM: Blood pressure 108/71, pulse 95, temperature 98.7 F (37.1 C), temperature source Oral, height  5\' 2"  (1.575 m), weight 219 lb (99.3 kg), SpO2 97 %. Body mass index is 40.06 kg/m. Physical Exam Vitals signs reviewed.  Constitutional:      Appearance: Normal appearance. She is obese.  Cardiovascular:     Rate and Rhythm: Normal rate.     Pulses: Normal pulses.  Pulmonary:     Effort: Pulmonary effort is normal.     Breath sounds: Normal breath sounds.  Musculoskeletal: Normal range of motion.  Skin:    General: Skin is warm and dry.  Neurological:     Mental Status: She is alert and oriented to person, place, and time.  Psychiatric:        Behavior: Behavior normal.   RECENT LABS AND TESTS: BMET    Component Value Date/Time   NA 140 05/04/2019 1120   K 4.3 05/04/2019 1120   CL 102 05/04/2019 1120   CO2 23 05/04/2019 1120   GLUCOSE 107 (H) 05/04/2019 1120   GLUCOSE 107 (H) 08/22/2017 0932   BUN 12 05/04/2019 1120   CREATININE 0.67 05/04/2019 1120   CREATININE 0.65 08/22/2017 0932   CALCIUM 9.4 05/04/2019 1120   GFRNONAA 98 05/04/2019 1120   GFRNONAA 100 08/22/2017 0932   GFRAA 113 05/04/2019 1120   GFRAA 116 08/22/2017 0932   Lab Results  Component Value Date   HGBA1C 5.9 (H) 05/04/2019   Lab Results  Component Value Date   INSULIN 34.6 (H) 05/04/2019   CBC    Component Value Date/Time   WBC  4.7 11/06/2016 1422   RBC 4.82 11/06/2016 1422   HGB 15.0 11/06/2016 1422   HCT 44.3 11/06/2016 1422   PLT 215 11/06/2016 1422   MCV 91.9 11/06/2016 1422   MCH 31.1 11/06/2016 1422   MCHC 33.9 11/06/2016 1422   RDW 13.8 11/06/2016 1422   LYMPHSABS 2,115 11/06/2016 1422   MONOABS 329 11/06/2016 1422   EOSABS 94 11/06/2016 1422   BASOSABS 0 11/06/2016 1422   Iron/TIBC/Ferritin/ %Sat No results found for: IRON, TIBC, FERRITIN, IRONPCTSAT Lipid Panel     Component Value Date/Time   CHOL 138 05/04/2019 1120   TRIG 71 05/04/2019 1120   HDL 48 05/04/2019 1120   LDLCALC 76 05/04/2019 1120   Hepatic Function Panel     Component Value Date/Time   PROT 7.8 05/04/2019 1120   ALBUMIN 4.3 05/04/2019 1120   AST 22 05/04/2019 1120   ALT 22 05/04/2019 1120   ALKPHOS 89 05/04/2019 1120   BILITOT 0.3 05/04/2019 1120      Component Value Date/Time   TSH 1.490 05/04/2019 1120   Results for SAIDEY, EDWARDS (MRN [id number]) as of 06/22/2019 14:16  Ref. Range 05/04/2019 11:20  Vitamin D, 25-Hydroxy Latest Ref Range: 30.0 - 100.0 ng/mL 32.7   OBESITY BEHAVIORAL INTERVENTION VISIT  Today's visit was #4  Starting weight: 229 lbs Starting date: 05/04/2019 Today's weight: 219 lbs  Today's date: 06/17/2019 Total lbs lost to date: 10    06/17/2019  Height 5\' 2"  (1.575 m)  Weight 219 lb (99.3 kg)  BMI (Calculated) 40.05  BLOOD PRESSURE - SYSTOLIC [id number]  BLOOD PRESSURE - DIASTOLIC 71   Body Fat % 99991111 %  Total Body Water (lbs) 80.8 lbs   ASK: We discussed the diagnosis of obesity with Judith Morgan today and Judith Morgan agreed to give [place] permission to discuss obesity behavioral modification therapy today.  ASSESS: Aalyssa has the diagnosis of obesity and her BMI today is 40.1. Alyne is in the action stage  of change.   ADVISE: Judith Morgan was educated on the multiple health risks of obesity as well as the benefit of weight loss to improve her health. She was advised of the need for long  term treatment and the importance of lifestyle modifications to improve her current health and to decrease her risk of future health problems.  AGREE: Multiple dietary modification options and treatment options were discussed and  Donyetta agreed to follow the recommendations documented in the above note.  ARRANGE: Delanda was educated on the importance of frequent visits to treat obesity as outlined per CMS and USPSTF guidelines and agreed to schedule her next follow up appointment today.  Migdalia Dk, am acting as Location manager for [employer], DO  I have reviewed the above documentation for accuracy and completeness, and I agree with the above. -Jearld Lesch, DO

## 2019-06-29 ENCOUNTER — Other Ambulatory Visit (INDEPENDENT_AMBULATORY_CARE_PROVIDER_SITE_OTHER): Payer: Self-pay | Admitting: Family Medicine

## 2019-06-29 DIAGNOSIS — E559 Vitamin D deficiency, unspecified: Secondary | ICD-10-CM

## 2019-07-07 ENCOUNTER — Other Ambulatory Visit: Payer: Self-pay

## 2019-07-07 ENCOUNTER — Ambulatory Visit (INDEPENDENT_AMBULATORY_CARE_PROVIDER_SITE_OTHER): Payer: 59 | Admitting: Bariatrics

## 2019-07-07 ENCOUNTER — Encounter (INDEPENDENT_AMBULATORY_CARE_PROVIDER_SITE_OTHER): Payer: Self-pay | Admitting: Bariatrics

## 2019-07-07 VITALS — BP 123/78 | HR 85 | Temp 98.7°F | Ht 62.0 in | Wt 217.0 lb

## 2019-07-07 DIAGNOSIS — E559 Vitamin D deficiency, unspecified: Secondary | ICD-10-CM | POA: Diagnosis not present

## 2019-07-07 DIAGNOSIS — Z9189 Other specified personal risk factors, not elsewhere classified: Secondary | ICD-10-CM

## 2019-07-07 DIAGNOSIS — R7303 Prediabetes: Secondary | ICD-10-CM

## 2019-07-07 DIAGNOSIS — I10 Essential (primary) hypertension: Secondary | ICD-10-CM | POA: Diagnosis not present

## 2019-07-07 DIAGNOSIS — Z6839 Body mass index (BMI) 39.0-39.9, adult: Secondary | ICD-10-CM

## 2019-07-07 MED ORDER — METFORMIN HCL 500 MG PO TABS: 500.0000 mg | ORAL_TABLET | Freq: Every day | ORAL | 0 refills | Status: DC

## 2019-07-07 MED ORDER — VITAMIN D (ERGOCALCIFEROL) 1.25 MG (50000 UNIT) PO CAPS: 50000.0000 [IU] | ORAL_CAPSULE | ORAL | 0 refills | Status: DC

## 2019-07-07 NOTE — Progress Notes (Signed)
Office: [phone]  /  Fax: [phone]   HPI:   Chief Complaint: OBESITY Judith Morgan is here to discuss her progress with her obesity treatment plan. She is on the Category 3 plan and alternating with Category 2 options and is following her eating plan approximately 100 % of the time. She states she is exercising 0 minutes 0 times per week. Judith Morgan has lost 2 lbs and has done relatively well overall. She is discouraged. She has increased her water intake.  Her weight is 217 lb (98.4 kg) today and has had a weight loss of 2 pounds over a period of 3 weeks since her last visit. She has lost 12 lbs since starting treatment with [place].  Hypertension Judith Morgan is a 58 y.o. female with hypertension. [name]'s blood pressure is well controlled. She denies chest pain. She is working on weight loss to help control her blood pressure with the goal of decreasing her risk of heart attack and stroke.   Vitamin D Deficiency Judith Morgan has a diagnosis of vitamin D deficiency. She is currently taking prescription Vit D. Last Vit D level was 32.7. She denies nausea, vomiting or muscle weakness.  At risk for osteopenia and osteoporosis Judith Morgan is at higher risk of osteopenia and osteoporosis due to vitamin D deficiency.   Pre-Diabetes Judith Morgan has a diagnosis of pre-diabetes based on her elevated Hgb A1c and was informed this puts her at greater risk of developing diabetes. Last A1c was 5.9 and insulin of 34.6. She is not taking metformin currently and notes polyphagia at night. She continues to work on diet and exercise to decrease risk of diabetes. She denies hypoglycemia.  ASSESSMENT AND PLAN:  Essential hypertension  Vitamin D deficiency - Plan: Vitamin D, Ergocalciferol, (DRISDOL) 1.25 MG (50000 UT) CAPS capsule  Prediabetes - Plan: metFORMIN (GLUCOPHAGE) 500 MG tablet  At risk for osteoporosis  Class 2 severe obesity with serious comorbidity and body mass index (BMI) of 39.0 to 39.9 in adult,  unspecified obesity type ([name])  PLAN:  Hypertension We discussed sodium restriction, working on healthy weight loss, and a regular exercise program as the means to achieve improved blood pressure control. [name] agreed with this plan and agreed to follow up as directed. We will continue to monitor her blood pressure as well as her progress with the above lifestyle modifications. [name] agrees to continue her medications and will watch for signs of hypotension as she continues her lifestyle modifications. [name] agrees to follow up with our clinic in 2 to 3 weeks.  Vitamin D Deficiency Judith Morgan was informed that low vitamin D levels contributes to fatigue and are associated with obesity, breast, and colon cancer. Judith Morgan agrees to continue taking prescription Vit D 50,000 IU every week #4 and we will refill for 1 month. She will follow up for routine testing of vitamin D, at least 2-3 times per year. She was informed of the risk of over-replacement of vitamin D and agrees to not increase her dose unless she discusses this with [place] first. Judith Morgan agrees to follow up with our clinic in 2 to 3 weeks.  At risk for osteopenia and osteoporosis Judith Morgan was given extended (15 minutes) osteoporosis prevention counseling today. Judith Morgan is at risk for osteopenia and osteoporsis due to her vitamin D deficiency. She was encouraged to take her vitamin D and follow her higher calcium diet and increase strengthening exercise to help strengthen her bones and decrease her risk of osteopenia and osteoporosis.  Pre-Diabetes Judith Morgan will continue to  work on weight loss, exercise, and decreasing simple carbohydrates in her diet to help decrease the risk of diabetes. We dicussed metformin including benefits and risks. She was informed that eating too many simple carbohydrates or too many calories at one sitting increases the likelihood of GI side effects. Judith Morgan agrees to start metformin 500 mg 1 tablet PO at lunch daily #30 with no  refills. Judith Morgan agrees to follow up with our clinic in 2 to 3 weeks as directed to monitor her progress.  Obesity Judith Morgan is currently in the action stage of change. As such, her goal is to continue with weight loss efforts She has agreed to follow the Category 2 plan + 100 calories Judith Morgan has been instructed to work up to a goal of 150 minutes of combined cardio and strengthening exercise per week for weight loss and overall health benefits. We discussed the following Behavioral Modification Strategies today: increasing lean protein intake, decreasing simple carbohydrates, increasing vegetables, decrease eating out, work on meal planning and easy cooking plans, ways to avoid boredom eating, decrease liquid calories, no skipping meals, keeping healthy foods in the home, and planning for success   Judith Morgan has agreed to follow up with our clinic in 2 to 3 weeks. She was informed of the importance of frequent follow up visits to maximize her success with intensive lifestyle modifications for her multiple health conditions.  ALLERGIES: Allergies  Allergen Reactions   Norco [Hydrocodone-Acetaminophen] Nausea And Vomiting    Vicodin and that class of meds    Other Itching    Red peppers    MEDICATIONS: Current Outpatient Medications on File Prior to Visit  Medication Sig Dispense Refill   amLODipine (NORVASC) 10 MG tablet Take 10 mg by mouth daily.     aspirin 81 MG chewable tablet Chew by mouth daily.     Cholecalciferol (VITAMIN D3) 50 MCG (2000 UT) capsule Take 2,000 Units by mouth daily.     ibuprofen (ADVIL,MOTRIN) 200 MG tablet Take 800 mg by mouth every 6 (six) hours as needed for mild pain.      Magnesium 500 MG CAPS Take by mouth.     metoprolol succinate (TOPROL-XL) 25 MG 24 hr tablet TAKE 1 TABLET BY MOUTH EVERY DAY FOR BLOOD PRESSURE  5   Multiple Vitamin (MULTIVITAMIN WITH MINERALS) TABS tablet Take 1 tablet by mouth daily.     spironolactone (ALDACTONE) 25 MG tablet  Take by mouth.     torsemide (DEMADEX) 5 MG tablet Take 5 mg by mouth as needed.      traMADol (ULTRAM) 50 MG tablet Take 1 tablet (50 mg total) by mouth every 6 (six) hours as needed for moderate pain or severe pain. 20 tablet 0   Current Facility-Administered Medications on File Prior to Visit  Medication Dose Route Frequency Provider Last Rate Last Dose   0.9 %  sodium chloride infusion  500 mL Intravenous Once Irene Shipper, MD        PAST MEDICAL HISTORY: Past Medical History:  Diagnosis Date   Allergy    Arthritis    Asthma    Back pain    Food allergy    GERD (gastroesophageal reflux disease)    Hepatitis C    Hypertension    Knee pain    Lactose intolerance    Migraines    Obesity    Palpitations    Vitamin D deficiency     PAST SURGICAL HISTORY: Past Surgical History:  Procedure Laterality Date  ABDOMINAL HYSTERECTOMY  1997   left ovaries   OVARIAN CYST REMOVAL Right 1992   ruptured cyst   POLYDACTYLY RECONSTRUCTION      SOCIAL HISTORY: Social History   Tobacco Use   Smoking status: Never Smoker   Smokeless tobacco: Never Used  Substance Use Topics   Alcohol use: No   Drug use: No    FAMILY HISTORY: Family History  Problem Relation Age of Onset   Diabetes Mother    Heart disease Mother    High blood pressure Mother    High Cholesterol Mother    Eating disorder Mother    Obesity Mother    Diabetes Father    Prostate cancer Father    Heart failure Father    High blood pressure Father    Sleep apnea Father    Heart disease Father    Dementia Maternal Grandmother    Heart failure Paternal Grandmother    Diabetes Maternal Aunt    Stroke Maternal Aunt    Hypertension Maternal Aunt     ROS: Review of Systems  Constitutional: Positive for weight loss.  Cardiovascular: Negative for chest pain.  Gastrointestinal: Negative for nausea and vomiting.  Musculoskeletal:       Negative muscle weakness    Endo/Heme/Allergies:       Positive polyphagia Negative hypoglycemia    PHYSICAL EXAM: Blood pressure 123/78, pulse 85, temperature 98.7 F (37.1 C), height 5\' 2"  (1.575 m), weight 217 lb (98.4 kg), SpO2 98 %. Body mass index is 39.69 kg/m. Physical Exam Vitals signs reviewed.  Constitutional:      Appearance: Normal appearance. She is obese.  Cardiovascular:     Rate and Rhythm: Normal rate.     Pulses: Normal pulses.  Pulmonary:     Effort: Pulmonary effort is normal.     Breath sounds: Normal breath sounds.  Musculoskeletal: Normal range of motion.  Skin:    General: Skin is warm and dry.  Neurological:     Mental Status: She is alert and oriented to person, place, and time.  Psychiatric:        Mood and Affect: Mood normal.        Behavior: Behavior normal.     RECENT LABS AND TESTS: BMET    Component Value Date/Time   NA 140 05/04/2019 1120   K 4.3 05/04/2019 1120   CL 102 05/04/2019 1120   CO2 23 05/04/2019 1120   GLUCOSE 107 (H) 05/04/2019 1120   GLUCOSE 107 (H) 08/22/2017 0932   BUN 12 05/04/2019 1120   CREATININE 0.67 05/04/2019 1120   CREATININE 0.65 08/22/2017 0932   CALCIUM 9.4 05/04/2019 1120   GFRNONAA 98 05/04/2019 1120   GFRNONAA 100 08/22/2017 0932   GFRAA 113 05/04/2019 1120   GFRAA 116 08/22/2017 0932   Lab Results  Component Value Date   HGBA1C 5.9 (H) 05/04/2019   Lab Results  Component Value Date   INSULIN 34.6 (H) 05/04/2019   CBC    Component Value Date/Time   WBC 4.7 11/06/2016 1422   RBC 4.82 11/06/2016 1422   HGB 15.0 11/06/2016 1422   HCT 44.3 11/06/2016 1422   PLT 215 11/06/2016 1422   MCV 91.9 11/06/2016 1422   MCH 31.1 11/06/2016 1422   MCHC 33.9 11/06/2016 1422   RDW 13.8 11/06/2016 1422   LYMPHSABS 2,115 11/06/2016 1422   MONOABS 329 11/06/2016 1422   EOSABS 94 11/06/2016 1422   BASOSABS 0 11/06/2016 1422   Iron/TIBC/Ferritin/ %Sat No results found  for: IRON, TIBC, FERRITIN, IRONPCTSAT Lipid Panel      Component Value Date/Time   CHOL 138 05/04/2019 1120   TRIG 71 05/04/2019 1120   HDL 48 05/04/2019 1120   LDLCALC 76 05/04/2019 1120   Hepatic Function Panel     Component Value Date/Time   PROT 7.8 05/04/2019 1120   ALBUMIN 4.3 05/04/2019 1120   AST 22 05/04/2019 1120   ALT 22 05/04/2019 1120   ALKPHOS 89 05/04/2019 1120   BILITOT 0.3 05/04/2019 1120      Component Value Date/Time   TSH 1.490 05/04/2019 1120      OBESITY BEHAVIORAL INTERVENTION VISIT  Today's visit was # 5   Starting weight: 229 lbs Starting date: 05/04/2019 Today's weight : 217 lbs  Today's date: 07/07/2019 Total lbs lost to date: 12    ASK: We discussed the diagnosis of obesity with Chosen Linton Flemings today and Anays agreed to give [place] permission to discuss obesity behavioral modification therapy today.  ASSESS: Ysabella has the diagnosis of obesity and her BMI today is 39.68 Leaner is in the action stage of change   ADVISE: Tinea was educated on the multiple health risks of obesity as well as the benefit of weight loss to improve her health. She was advised of the need for long term treatment and the importance of lifestyle modifications to improve her current health and to decrease her risk of future health problems.  AGREE: Multiple dietary modification options and treatment options were discussed and  Carleigh agreed to follow the recommendations documented in the above note.  ARRANGE: Terrion was educated on the importance of frequent visits to treat obesity as outlined per CMS and USPSTF guidelines and agreed to schedule her next follow up appointment today.  Wilhemena Durie, am acting as transcriptionist for [employer], DO  I have reviewed the above documentation for accuracy and completeness, and I agree with the above. -Jearld Lesch, DO

## 2019-07-09 ENCOUNTER — Encounter (INDEPENDENT_AMBULATORY_CARE_PROVIDER_SITE_OTHER): Payer: Self-pay | Admitting: Bariatrics

## 2019-07-16 DIAGNOSIS — M549 Dorsalgia, unspecified: Secondary | ICD-10-CM | POA: Diagnosis not present

## 2019-07-16 DIAGNOSIS — I10 Essential (primary) hypertension: Secondary | ICD-10-CM | POA: Diagnosis not present

## 2019-07-16 DIAGNOSIS — E669 Obesity, unspecified: Secondary | ICD-10-CM | POA: Diagnosis not present

## 2019-07-28 ENCOUNTER — Encounter (INDEPENDENT_AMBULATORY_CARE_PROVIDER_SITE_OTHER): Payer: Self-pay | Admitting: Family Medicine

## 2019-07-28 ENCOUNTER — Other Ambulatory Visit: Payer: Self-pay

## 2019-07-28 ENCOUNTER — Ambulatory Visit (INDEPENDENT_AMBULATORY_CARE_PROVIDER_SITE_OTHER): Payer: 59 | Admitting: Family Medicine

## 2019-07-28 VITALS — BP 108/70 | HR 77 | Temp 98.3°F | Ht 62.0 in | Wt 217.0 lb

## 2019-07-28 DIAGNOSIS — Z9189 Other specified personal risk factors, not elsewhere classified: Secondary | ICD-10-CM

## 2019-07-28 DIAGNOSIS — R7303 Prediabetes: Secondary | ICD-10-CM | POA: Diagnosis not present

## 2019-07-28 DIAGNOSIS — E559 Vitamin D deficiency, unspecified: Secondary | ICD-10-CM

## 2019-07-28 DIAGNOSIS — Z6839 Body mass index (BMI) 39.0-39.9, adult: Secondary | ICD-10-CM

## 2019-07-28 MED ORDER — VITAMIN D (ERGOCALCIFEROL) 1.25 MG (50000 UNIT) PO CAPS: 50000.0000 [IU] | ORAL_CAPSULE | ORAL | 0 refills | Status: DC

## 2019-07-28 MED ORDER — METFORMIN HCL 500 MG PO TABS: 500.0000 mg | ORAL_TABLET | Freq: Every day | ORAL | 0 refills | Status: DC

## 2019-08-03 NOTE — Progress Notes (Signed)
Office: [phone]  /  Fax: [phone]   HPI:   Chief Complaint: OBESITY Judith Morgan is here to discuss her progress with her obesity treatment plan. She is on the Category 2 plan and is following her eating plan approximately 25 % of the time. She states she is doing cardio and strengthening exercises 15 minutes 7 times per week. Judith Morgan has been short of grocery money and this has been a challenge recently. She is having pork rinds for lunch for protein. She will be able to afford groceries in about ten days. She was recently on a trip to [place] and she feels she did well because she avoided hoagies and Tastykakes. Her weight is 217 lb (98.4 kg) today and she has maintained weight since her last visit. She has lost 12 lbs since starting treatment with [place].  Vitamin D deficiency Judith Morgan has a diagnosis of vitamin D deficiency. Judith Morgan is currently taking vit D. Her last vitamin D level was at 32.7 and was not at goal. She denies nausea, vomiting or muscle weakness.  Pre-Diabetes Judith Morgan has a diagnosis of prediabetes based on her elevated Hgb A1. Her last A1c was at 5.9 She started on metformin at the last visit, and she denies nausea or diarrhea. Metformin does decrease polyphagia. Her blood sugars have been higher in the morning since she has been off the plan. She continues to work on diet and exercise to decrease risk of diabetes.   At risk for diabetes Judith Morgan is at higher than average risk for developing diabetes due to her obesity and prediabetes. She currently denies polyuria or polydipsia.  ASSESSMENT AND PLAN:  Vitamin D deficiency - Plan: Vitamin D, Ergocalciferol, (DRISDOL) 1.25 MG (50000 UT) CAPS capsule  Prediabetes - Plan: metFORMIN (GLUCOPHAGE) 500 MG tablet  At risk for diabetes mellitus  Class 2 severe obesity with serious comorbidity and body mass index (BMI) of 39.0 to 39.9 in adult, unspecified obesity type (Judith Morgan)  PLAN:  Vitamin D Deficiency Judith Morgan was  informed that low vitamin D levels contributes to fatigue and are associated with obesity, breast, and colon cancer. Judith Morgan agrees to continue to take prescription Vit D @50 ,000 IU every week #4 with no refills and she will follow up for routine testing of vitamin D, at least 2-3 times per year. She was informed of the risk of over-replacement of vitamin D and agrees to not increase her dose unless she discusses this with [place] first. Shamia agrees to follow up with our clinic in 2 to 3 weeks.  Pre-Diabetes Judith Morgan will continue to work on weight loss, exercise, and decreasing simple carbohydrates in her diet to help decrease the risk of diabetes. Judith Morgan agrees to continue metformin 500 mg qAM #30 with no refills and follow up with [place] as directed to monitor her progress.  Diabetes risk counselling Judith Morgan was given extended (15 minutes) diabetes prevention counseling today. She is 58 y.o. female and has risk factors for diabetes including obesity and prediabetes. We discussed intensive lifestyle modifications today with an emphasis on weight loss as well as increasing exercise and decreasing simple carbohydrates in her diet.  Obesity Judith Morgan is currently in the action stage of change. As such, her goal is to continue with weight loss efforts She has agreed to keep a food journal with 400 to 500 calories and 35 grams of protein at supper daily and follow the Category 2 plan +100 calories We discussed the following Behavioral Modification Strategies today: planning for success, no skipping meals and  increasing lean protein intake  We discussed options for protein other than meat.  Wilba has agreed to follow up with our clinic in 2 to 3 weeks. She was informed of the importance of frequent follow up visits to maximize her success with intensive lifestyle modifications for her multiple health conditions.  ALLERGIES: Allergies  Allergen Reactions  . Norco [Hydrocodone-Acetaminophen] Nausea And Vomiting     Vicodin and that class of meds   . Other Itching    Red peppers    MEDICATIONS: Current Outpatient Medications on File Prior to Visit  Medication Sig Dispense Refill  . amLODipine (NORVASC) 10 MG tablet Take 10 mg by mouth daily.    Marland Kitchen aspirin 81 MG chewable tablet Chew by mouth daily.    . Cholecalciferol (VITAMIN D3) 50 MCG (2000 UT) capsule Take 2,000 Units by mouth daily.    Marland Kitchen ibuprofen (ADVIL,MOTRIN) 200 MG tablet Take 800 mg by mouth every 6 (six) hours as needed for mild pain.     . Magnesium 500 MG CAPS Take by mouth.    . metoprolol succinate (TOPROL-XL) 25 MG 24 hr tablet TAKE 1 TABLET BY MOUTH EVERY DAY FOR BLOOD PRESSURE  5  . Multiple Vitamin (MULTIVITAMIN WITH MINERALS) TABS tablet Take 1 tablet by mouth daily.    Marland Kitchen spironolactone (ALDACTONE) 25 MG tablet Take by mouth.    . torsemide (DEMADEX) 5 MG tablet Take 5 mg by mouth as needed.     . traMADol (ULTRAM) 50 MG tablet Take 1 tablet (50 mg total) by mouth every 6 (six) hours as needed for moderate pain or severe pain. 20 tablet 0   Current Facility-Administered Medications on File Prior to Visit  Medication Dose Route Frequency Provider Last Rate Last Dose  . 0.9 %  sodium chloride infusion  500 mL Intravenous Once Irene Shipper, MD        PAST MEDICAL HISTORY: Past Medical History:  Diagnosis Date  . Allergy   . Arthritis   . Asthma   . Back pain   . Food allergy   . GERD (gastroesophageal reflux disease)   . Hepatitis C   . Hypertension   . Knee pain   . Lactose intolerance   . Migraines   . Obesity   . Palpitations   . Vitamin D deficiency     PAST SURGICAL HISTORY: Past Surgical History:  Procedure Laterality Date  . ABDOMINAL HYSTERECTOMY  1997   left ovaries  . OVARIAN CYST REMOVAL Right 1992   ruptured cyst  . POLYDACTYLY RECONSTRUCTION      SOCIAL HISTORY: Social History   Tobacco Use  . Smoking status: Never Smoker  . Smokeless tobacco: Never Used  Substance Use Topics  . Alcohol  use: No  . Drug use: No    FAMILY HISTORY: Family History  Problem Relation Age of Onset  . Diabetes Mother   . Heart disease Mother   . High blood pressure Mother   . High Cholesterol Mother   . Eating disorder Mother   . Obesity Mother   . Diabetes Father   . Prostate cancer Father   . Heart failure Father   . High blood pressure Father   . Sleep apnea Father   . Heart disease Father   . Dementia Maternal Grandmother   . Heart failure Paternal Grandmother   . Diabetes Maternal Aunt   . Stroke Maternal Aunt   . Hypertension Maternal Aunt     ROS: Review of Systems  Constitutional: Negative for weight loss.  Gastrointestinal: Negative for nausea and vomiting.  Genitourinary: Negative for frequency.  Musculoskeletal:       Negative for muscle weakness  Endo/Heme/Allergies: Negative for polydipsia.       Positive for polyphagia    PHYSICAL EXAM: Blood pressure 108/70, pulse 77, temperature 98.3 F (36.8 C), temperature source Oral, height 5\' 2"  (1.575 m), weight 217 lb (98.4 kg), SpO2 94 %. Body mass index is 39.69 kg/m. Physical Exam Vitals signs reviewed.  Constitutional:      Appearance: Normal appearance. She is well-developed. She is obese.  Cardiovascular:     Rate and Rhythm: Normal rate.  Pulmonary:     Effort: Pulmonary effort is normal.  Musculoskeletal: Normal range of motion.  Skin:    General: Skin is warm and dry.  Neurological:     Mental Status: She is alert and oriented to person, place, and time.  Psychiatric:        Mood and Affect: Mood normal.        Behavior: Behavior normal.     RECENT LABS AND TESTS: BMET    Component Value Date/Time   NA 140 05/04/2019 1120   K 4.3 05/04/2019 1120   CL 102 05/04/2019 1120   CO2 23 05/04/2019 1120   GLUCOSE 107 (H) 05/04/2019 1120   GLUCOSE 107 (H) 08/22/2017 0932   BUN 12 05/04/2019 1120   CREATININE 0.67 05/04/2019 1120   CREATININE 0.65 08/22/2017 0932   CALCIUM 9.4 05/04/2019 1120    GFRNONAA 98 05/04/2019 1120   GFRNONAA 100 08/22/2017 0932   GFRAA 113 05/04/2019 1120   GFRAA 116 08/22/2017 0932   Lab Results  Component Value Date   HGBA1C 5.9 (H) 05/04/2019   Lab Results  Component Value Date   INSULIN 34.6 (H) 05/04/2019   CBC    Component Value Date/Time   WBC 4.7 11/06/2016 1422   RBC 4.82 11/06/2016 1422   HGB 15.0 11/06/2016 1422   HCT 44.3 11/06/2016 1422   PLT 215 11/06/2016 1422   MCV 91.9 11/06/2016 1422   MCH 31.1 11/06/2016 1422   MCHC 33.9 11/06/2016 1422   RDW 13.8 11/06/2016 1422   LYMPHSABS 2,115 11/06/2016 1422   MONOABS 329 11/06/2016 1422   EOSABS 94 11/06/2016 1422   BASOSABS 0 11/06/2016 1422   Iron/TIBC/Ferritin/ %Sat No results found for: IRON, TIBC, FERRITIN, IRONPCTSAT Lipid Panel     Component Value Date/Time   CHOL 138 05/04/2019 1120   TRIG 71 05/04/2019 1120   HDL 48 05/04/2019 1120   LDLCALC 76 05/04/2019 1120   Hepatic Function Panel     Component Value Date/Time   PROT 7.8 05/04/2019 1120   ALBUMIN 4.3 05/04/2019 1120   AST 22 05/04/2019 1120   ALT 22 05/04/2019 1120   ALKPHOS 89 05/04/2019 1120   BILITOT 0.3 05/04/2019 1120      Component Value Date/Time   TSH 1.490 05/04/2019 1120     Ref. Range 05/04/2019 11:20  Vitamin D, 25-Hydroxy Latest Ref Range: 30.0 - 100.0 ng/mL 32.7    OBESITY BEHAVIORAL INTERVENTION VISIT  Today's visit was # 6   Starting weight: 229 lbs Starting date: 05/04/2019 Today's weight : 217 lbs Today's date: 07/28/2019 Total lbs lost to date: 12    07/28/2019  Height 5\' 2"  (1.575 m)  Weight 217 lb (98.4 kg)  BMI (Calculated) 39.68  BLOOD PRESSURE - SYSTOLIC [id number]  BLOOD PRESSURE - DIASTOLIC 70   Body Fat % 0000000 %  Total Body Water (lbs) 78.8 lbs    ASK: We discussed the diagnosis of obesity with Rosamond Linton Flemings today and Jozie agreed to give [place] permission to discuss obesity behavioral modification therapy today.  ASSESS: Meko has the diagnosis of obesity and  her BMI today is 39.68 Maryella is in the action stage of change   ADVISE: Anayiah was educated on the multiple health risks of obesity as well as the benefit of weight loss to improve her health. She was advised of the need for long term treatment and the importance of lifestyle modifications to improve her current health and to decrease her risk of future health problems.  AGREE: Multiple dietary modification options and treatment options were discussed and  Vicke agreed to follow the recommendations documented in the above note.  ARRANGE: Venera was educated on the importance of frequent visits to treat obesity as outlined per CMS and USPSTF guidelines and agreed to schedule her next follow up appointment today.  I, Doreene Nest, am acting as transcriptionist for [employer], FNP-C  I have reviewed the above documentation for accuracy and completeness, and I agree with the above.  - Whitnee Orzel, FNP-C.

## 2019-08-04 ENCOUNTER — Encounter (INDEPENDENT_AMBULATORY_CARE_PROVIDER_SITE_OTHER): Payer: Self-pay | Admitting: Family Medicine

## 2019-08-18 ENCOUNTER — Encounter (INDEPENDENT_AMBULATORY_CARE_PROVIDER_SITE_OTHER): Payer: Self-pay

## 2019-08-19 ENCOUNTER — Ambulatory Visit (INDEPENDENT_AMBULATORY_CARE_PROVIDER_SITE_OTHER): Payer: 59 | Admitting: Family Medicine

## 2019-08-20 ENCOUNTER — Encounter (INDEPENDENT_AMBULATORY_CARE_PROVIDER_SITE_OTHER): Payer: Self-pay | Admitting: Bariatrics

## 2019-08-20 ENCOUNTER — Ambulatory Visit (INDEPENDENT_AMBULATORY_CARE_PROVIDER_SITE_OTHER): Payer: 59 | Admitting: Bariatrics

## 2019-08-20 ENCOUNTER — Other Ambulatory Visit: Payer: Self-pay

## 2019-08-20 VITALS — BP 112/79 | HR 81 | Temp 98.6°F | Ht 62.0 in | Wt 214.0 lb

## 2019-08-20 DIAGNOSIS — E559 Vitamin D deficiency, unspecified: Secondary | ICD-10-CM | POA: Diagnosis not present

## 2019-08-20 DIAGNOSIS — Z9189 Other specified personal risk factors, not elsewhere classified: Secondary | ICD-10-CM | POA: Diagnosis not present

## 2019-08-20 DIAGNOSIS — R7303 Prediabetes: Secondary | ICD-10-CM

## 2019-08-20 DIAGNOSIS — Z6839 Body mass index (BMI) 39.0-39.9, adult: Secondary | ICD-10-CM

## 2019-08-20 MED ORDER — METFORMIN HCL 500 MG PO TABS: 500.0000 mg | ORAL_TABLET | Freq: Every day | ORAL | 1 refills | Status: DC

## 2019-08-20 MED ORDER — VITAMIN D (ERGOCALCIFEROL) 1.25 MG (50000 UNIT) PO CAPS: 50000.0000 [IU] | ORAL_CAPSULE | ORAL | 1 refills | Status: AC

## 2019-08-24 ENCOUNTER — Encounter (INDEPENDENT_AMBULATORY_CARE_PROVIDER_SITE_OTHER): Payer: Self-pay | Admitting: Bariatrics

## 2019-08-24 ENCOUNTER — Encounter (INDEPENDENT_AMBULATORY_CARE_PROVIDER_SITE_OTHER): Payer: Self-pay

## 2019-08-24 NOTE — Progress Notes (Signed)
Office: [phone]  /  Fax: [phone]   HPI:  Chief Complaint: OBESITY Judith Morgan is here to discuss her progress with her obesity treatment plan. She is on the Category 2 plan + 100 calories and journaling 400-500 calories + 35 grams of protein at supper and states she is following her eating plan approximately 0% of the time. She states she is doing cardio 15 minutes 7 times per week.  Judith Morgan is down 3 lbs, but states that she struggles with her finances, which makes it hard to follow the plan. She reports she is sleeping better.  Today's visit was #7 Starting weight: 229 lbs Starting date: 05/04/2019 Today's weight: 214 lbs  Today's date: 08/20/2019 Total lbs lost to date: 15 Total lbs lost since last in-office visit: 3   Vitamin D deficiency Judith Morgan has a diagnosis of Vitamin D deficiency and is taking prescription Vitamin D. No nausea, vomiting, or muscle weakness.  At risk for osteopenia and osteoporosis Asta is at higher risk of osteopenia and osteoporosis due to Vitamin D deficiency.   Prediabetes Judith Morgan has a diagnosis of prediabetes and is taking metformin. Last A1c 5.9 on 05/04/2019 with an insulin of 34.6. No polyphagia.  ASSESSMENT AND PLAN:  Vitamin D deficiency - Plan: Vitamin D, Ergocalciferol, (DRISDOL) 1.25 MG (50000 UT) CAPS capsule  Prediabetes - Plan: metFORMIN (GLUCOPHAGE) 500 MG tablet  At risk for osteoporosis  Class 2 severe obesity with serious comorbidity and body mass index (BMI) of 39.0 to 39.9 in adult, unspecified obesity type ([place])  PLAN:  Vitamin D Deficiency Judith Morgan was informed that low Vitamin D levels contributes to fatigue and are associated with obesity, breast, and colon cancer. She agrees to continue to take prescription Vit D @ 50,000 IU every week #5 with 1 refill and will follow-up for routine testing of Vitamin D, at least 2-3 times per year. She was informed of the risk of over-replacement of Vitamin D and agrees to not  increase her dose unless she discusses this with [place] first. Judith Morgan agrees to follow-up with our clinic in 2 months.  At risk for osteopenia and osteoporosis Judith Morgan was given extended  (15 minutes) osteoporosis prevention counseling today. Judith Morgan is at risk for osteopenia and osteoporosis due to her Vitamin D deficiency. She was encouraged to take her Vitamin D and follow her higher calcium diet and increase strengthening exercise to help strengthen her bones and decrease her risk of osteopenia and osteoporosis.  Pre-Diabetes Judith Morgan will continue to work on weight loss, exercise, and decreasing simple carbohydrates to help decrease the risk of diabetes. She was given a prescription for metformin 500 mg 1 PO daily #30 with 1 refill and agrees to follow-up with our clinic in 2 months.  Obesity Judith Morgan is currently in the action stage of change. As such, her goal is to continue with weight loss efforts. She has agreed to follow the Category 2 plan and journal 400-500 calories + 35 grams of protein at supper. Judith Morgan will work on meal planning, intentional eating, and increasing her water intake. We discussed protein sources. Judith Morgan has an accountability partner for exercise. We discussed the following Behavioral Modification Strategies today: increasing lean protein intake, decreasing simple carbohydrates, increasing vegetables, increase H20 intake, decrease eating out, no skipping meals, work on meal planning and easy cooking plans, and keeping healthy foods in the home.  Judith Morgan has agreed to follow-up with our clinic in 2 months. She was informed of the importance of frequent follow-up visits to maximize  her success with intensive lifestyle modifications for her multiple health conditions.  ALLERGIES: Allergies  Allergen Reactions  . Norco [Hydrocodone-Acetaminophen] Nausea And Vomiting    Vicodin and that class of meds   . Other Itching    Red peppers    MEDICATIONS: Current Outpatient  Medications on File Prior to Visit  Medication Sig Dispense Refill  . amLODipine (NORVASC) 10 MG tablet Take 10 mg by mouth daily.    Marland Kitchen aspirin 81 MG chewable tablet Chew by mouth daily.    . Cholecalciferol (VITAMIN D3) 50 MCG (2000 UT) capsule Take 2,000 Units by mouth daily.    Marland Kitchen ibuprofen (ADVIL,MOTRIN) 200 MG tablet Take 800 mg by mouth every 6 (six) hours as needed for mild pain.     . Magnesium 500 MG CAPS Take by mouth.    . metoprolol succinate (TOPROL-XL) 25 MG 24 hr tablet TAKE 1 TABLET BY MOUTH EVERY DAY FOR BLOOD PRESSURE  5  . Multiple Vitamin (MULTIVITAMIN WITH MINERALS) TABS tablet Take 1 tablet by mouth daily.    Marland Kitchen spironolactone (ALDACTONE) 25 MG tablet Take by mouth.    . torsemide (DEMADEX) 5 MG tablet Take 5 mg by mouth as needed.     . traMADol (ULTRAM) 50 MG tablet Take 1 tablet (50 mg total) by mouth every 6 (six) hours as needed for moderate pain or severe pain. 20 tablet 0   Current Facility-Administered Medications on File Prior to Visit  Medication Dose Route Frequency Provider Last Rate Last Admin  . 0.9 %  sodium chloride infusion  500 mL Intravenous Once Irene Shipper, MD        PAST MEDICAL HISTORY: Past Medical History:  Diagnosis Date  . Allergy   . Arthritis   . Asthma   . Back pain   . Food allergy   . GERD (gastroesophageal reflux disease)   . Hepatitis C   . Hypertension   . Knee pain   . Lactose intolerance   . Migraines   . Obesity   . Palpitations   . Vitamin D deficiency     PAST SURGICAL HISTORY: Past Surgical History:  Procedure Laterality Date  . ABDOMINAL HYSTERECTOMY  1997   left ovaries  . OVARIAN CYST REMOVAL Right 1992   ruptured cyst  . POLYDACTYLY RECONSTRUCTION      SOCIAL HISTORY: Social History   Tobacco Use  . Smoking status: Never Smoker  . Smokeless tobacco: Never Used  Substance Use Topics  . Alcohol use: No  . Drug use: No    FAMILY HISTORY: Family History  Problem Relation Age of Onset  .  Diabetes Mother   . Heart disease Mother   . High blood pressure Mother   . High Cholesterol Mother   . Eating disorder Mother   . Obesity Mother   . Diabetes Father   . Prostate cancer Father   . Heart failure Father   . High blood pressure Father   . Sleep apnea Father   . Heart disease Father   . Dementia Maternal Grandmother   . Heart failure Paternal Grandmother   . Diabetes Maternal Aunt   . Stroke Maternal Aunt   . Hypertension Maternal Aunt    ROS: Review of Systems  Constitutional: Positive for weight loss.  Gastrointestinal: Negative for nausea and vomiting.  Musculoskeletal:       Negative for muscle weakness.  Endo/Heme/Allergies:       Negative for polyphagia.   PHYSICAL EXAM: Blood pressure  112/79, pulse 81, temperature 98.6 F (37 C), height 5\' 2"  (1.575 m), weight 214 lb (97.1 kg), SpO2 99 %. Body mass index is 39.14 kg/m. Physical Exam Vitals reviewed.  Constitutional:      Appearance: Normal appearance. She is obese.  Cardiovascular:     Rate and Rhythm: Normal rate.     Pulses: Normal pulses.  Pulmonary:     Effort: Pulmonary effort is normal.     Breath sounds: Normal breath sounds.  Musculoskeletal:        General: Normal range of motion.  Skin:    General: Skin is warm and dry.  Neurological:     Mental Status: She is alert and oriented to person, place, and time.  Psychiatric:        Behavior: Behavior normal.   RECENT LABS AND TESTS: BMET    Component Value Date/Time   NA 140 05/04/2019 1120   K 4.3 05/04/2019 1120   CL 102 05/04/2019 1120   CO2 23 05/04/2019 1120   GLUCOSE 107 (H) 05/04/2019 1120   GLUCOSE 107 (H) 08/22/2017 0932   BUN 12 05/04/2019 1120   CREATININE 0.67 05/04/2019 1120   CREATININE 0.65 08/22/2017 0932   CALCIUM 9.4 05/04/2019 1120   GFRNONAA 98 05/04/2019 1120   GFRNONAA 100 08/22/2017 0932   GFRAA 113 05/04/2019 1120   GFRAA 116 08/22/2017 0932   Lab Results  Component Value Date   HGBA1C 5.9 (H)  05/04/2019   Lab Results  Component Value Date   INSULIN 34.6 (H) 05/04/2019   CBC    Component Value Date/Time   WBC 4.7 11/06/2016 1422   RBC 4.82 11/06/2016 1422   HGB 15.0 11/06/2016 1422   HCT 44.3 11/06/2016 1422   PLT 215 11/06/2016 1422   MCV 91.9 11/06/2016 1422   MCH 31.1 11/06/2016 1422   MCHC 33.9 11/06/2016 1422   RDW 13.8 11/06/2016 1422   LYMPHSABS 2,115 11/06/2016 1422   MONOABS 329 11/06/2016 1422   EOSABS 94 11/06/2016 1422   BASOSABS 0 11/06/2016 1422   Iron/TIBC/Ferritin/ %Sat No results found for: IRON, TIBC, FERRITIN, IRONPCTSAT Lipid Panel     Component Value Date/Time   CHOL 138 05/04/2019 1120   TRIG 71 05/04/2019 1120   HDL 48 05/04/2019 1120   LDLCALC 76 05/04/2019 1120   Hepatic Function Panel     Component Value Date/Time   PROT 7.8 05/04/2019 1120   ALBUMIN 4.3 05/04/2019 1120   AST 22 05/04/2019 1120   ALT 22 05/04/2019 1120   ALKPHOS 89 05/04/2019 1120   BILITOT 0.3 05/04/2019 1120      Component Value Date/Time   TSH 1.490 05/04/2019 1120    OBESITY BEHAVIORAL INTERVENTION VISIT DOCUMENTATION FOR INSURANCE (~15 minutes)  I, Michaelene Song, am acting as Location manager for [employer], DO  I have reviewed the above documentation for accuracy and completeness, and I agree with the above. Jearld Lesch, DO

## 2019-09-21 ENCOUNTER — Other Ambulatory Visit (INDEPENDENT_AMBULATORY_CARE_PROVIDER_SITE_OTHER): Payer: Self-pay | Admitting: Bariatrics

## 2019-09-21 DIAGNOSIS — E559 Vitamin D deficiency, unspecified: Secondary | ICD-10-CM

## 2019-10-10 DIAGNOSIS — I10 Essential (primary) hypertension: Secondary | ICD-10-CM | POA: Diagnosis not present

## 2019-10-10 DIAGNOSIS — Z03818 Encounter for observation for suspected exposure to other biological agents ruled out: Secondary | ICD-10-CM | POA: Diagnosis not present

## 2019-10-10 DIAGNOSIS — Z20828 Contact with and (suspected) exposure to other viral communicable diseases: Secondary | ICD-10-CM | POA: Diagnosis not present

## 2019-10-17 DIAGNOSIS — Z20828 Contact with and (suspected) exposure to other viral communicable diseases: Secondary | ICD-10-CM | POA: Diagnosis not present

## 2019-10-17 DIAGNOSIS — Z03818 Encounter for observation for suspected exposure to other biological agents ruled out: Secondary | ICD-10-CM | POA: Diagnosis not present

## 2019-10-19 ENCOUNTER — Ambulatory Visit (INDEPENDENT_AMBULATORY_CARE_PROVIDER_SITE_OTHER): Payer: 59 | Admitting: Family Medicine

## 2019-10-28 DIAGNOSIS — B029 Zoster without complications: Secondary | ICD-10-CM | POA: Diagnosis not present

## 2019-10-29 DIAGNOSIS — H2513 Age-related nuclear cataract, bilateral: Secondary | ICD-10-CM | POA: Diagnosis not present

## 2019-10-29 DIAGNOSIS — H04123 Dry eye syndrome of bilateral lacrimal glands: Secondary | ICD-10-CM | POA: Diagnosis not present

## 2019-12-03 ENCOUNTER — Other Ambulatory Visit: Payer: Self-pay | Admitting: Nurse Practitioner

## 2019-12-03 DIAGNOSIS — M549 Dorsalgia, unspecified: Secondary | ICD-10-CM | POA: Diagnosis not present

## 2019-12-03 DIAGNOSIS — J45909 Unspecified asthma, uncomplicated: Secondary | ICD-10-CM | POA: Diagnosis not present

## 2019-12-03 DIAGNOSIS — I10 Essential (primary) hypertension: Secondary | ICD-10-CM | POA: Diagnosis not present

## 2019-12-03 DIAGNOSIS — Z1231 Encounter for screening mammogram for malignant neoplasm of breast: Secondary | ICD-10-CM

## 2019-12-09 ENCOUNTER — Other Ambulatory Visit: Payer: Self-pay

## 2019-12-09 ENCOUNTER — Ambulatory Visit
Admission: RE | Admit: 2019-12-09 | Discharge: 2019-12-09 | Disposition: A | Discharge status: Home / Self Care | Payer: 59 | Source: Ambulatory Visit | Attending: Nurse Practitioner | Admitting: Nurse Practitioner

## 2019-12-09 DIAGNOSIS — Z1231 Encounter for screening mammogram for malignant neoplasm of breast: Secondary | ICD-10-CM | POA: Diagnosis not present

## 2020-02-03 DIAGNOSIS — E559 Vitamin D deficiency, unspecified: Secondary | ICD-10-CM | POA: Diagnosis not present

## 2020-02-18 DIAGNOSIS — E559 Vitamin D deficiency, unspecified: Secondary | ICD-10-CM | POA: Diagnosis not present

## 2020-02-18 DIAGNOSIS — R7301 Impaired fasting glucose: Secondary | ICD-10-CM | POA: Diagnosis not present

## 2020-02-18 DIAGNOSIS — R635 Abnormal weight gain: Secondary | ICD-10-CM | POA: Diagnosis not present

## 2020-02-18 DIAGNOSIS — N951 Menopausal and female climacteric states: Secondary | ICD-10-CM | POA: Diagnosis not present

## 2020-03-01 DIAGNOSIS — R7303 Prediabetes: Secondary | ICD-10-CM | POA: Diagnosis not present

## 2020-03-01 DIAGNOSIS — Z1339 Encounter for screening examination for other mental health and behavioral disorders: Secondary | ICD-10-CM | POA: Diagnosis not present

## 2020-03-01 DIAGNOSIS — I10 Essential (primary) hypertension: Secondary | ICD-10-CM | POA: Diagnosis not present

## 2020-03-01 DIAGNOSIS — E8881 Metabolic syndrome: Secondary | ICD-10-CM | POA: Diagnosis not present

## 2020-03-01 DIAGNOSIS — Z1331 Encounter for screening for depression: Secondary | ICD-10-CM | POA: Diagnosis not present

## 2020-03-01 DIAGNOSIS — G479 Sleep disorder, unspecified: Secondary | ICD-10-CM | POA: Diagnosis not present

## 2020-03-01 DIAGNOSIS — R232 Flushing: Secondary | ICD-10-CM | POA: Diagnosis not present

## 2020-03-01 DIAGNOSIS — N951 Menopausal and female climacteric states: Secondary | ICD-10-CM | POA: Diagnosis not present

## 2020-03-01 DIAGNOSIS — Z6841 Body Mass Index (BMI) 40.0 and over, adult: Secondary | ICD-10-CM | POA: Diagnosis not present

## 2020-03-09 DIAGNOSIS — R7303 Prediabetes: Secondary | ICD-10-CM | POA: Diagnosis not present

## 2020-04-08 DIAGNOSIS — N951 Menopausal and female climacteric states: Secondary | ICD-10-CM | POA: Diagnosis not present

## 2020-04-08 DIAGNOSIS — R232 Flushing: Secondary | ICD-10-CM | POA: Diagnosis not present

## 2020-04-19 DIAGNOSIS — R600 Localized edema: Secondary | ICD-10-CM | POA: Diagnosis not present

## 2020-04-19 DIAGNOSIS — M79604 Pain in right leg: Secondary | ICD-10-CM | POA: Diagnosis not present

## 2020-04-19 DIAGNOSIS — M79605 Pain in left leg: Secondary | ICD-10-CM | POA: Diagnosis not present

## 2020-04-20 DIAGNOSIS — M79604 Pain in right leg: Secondary | ICD-10-CM | POA: Diagnosis not present

## 2020-04-20 DIAGNOSIS — M79605 Pain in left leg: Secondary | ICD-10-CM | POA: Diagnosis not present

## 2020-04-27 DIAGNOSIS — I83893 Varicose veins of bilateral lower extremities with other complications: Secondary | ICD-10-CM | POA: Diagnosis not present

## 2020-04-27 DIAGNOSIS — R600 Localized edema: Secondary | ICD-10-CM | POA: Diagnosis not present

## 2020-05-05 ENCOUNTER — Other Ambulatory Visit: Payer: Self-pay | Admitting: Family Medicine

## 2020-05-05 DIAGNOSIS — R600 Localized edema: Secondary | ICD-10-CM

## 2020-05-16 ENCOUNTER — Ambulatory Visit
Admission: RE | Admit: 2020-05-16 | Discharge: 2020-05-16 | Disposition: A | Discharge status: Home / Self Care | Payer: 59 | Source: Ambulatory Visit | Attending: Family Medicine | Admitting: Family Medicine

## 2020-05-16 DIAGNOSIS — R600 Localized edema: Secondary | ICD-10-CM

## 2020-05-16 DIAGNOSIS — Z90710 Acquired absence of both cervix and uterus: Secondary | ICD-10-CM | POA: Diagnosis not present

## 2020-05-16 DIAGNOSIS — K5730 Diverticulosis of large intestine without perforation or abscess without bleeding: Secondary | ICD-10-CM | POA: Diagnosis not present

## 2020-05-16 DIAGNOSIS — M7989 Other specified soft tissue disorders: Secondary | ICD-10-CM | POA: Diagnosis not present

## 2020-05-16 MED ORDER — IOPAMIDOL (ISOVUE-370) INJECTION 76%
100.0000 mL | Freq: Once | INTRAVENOUS | Status: AC | PRN
  Administered 2020-05-16: 100 mL via INTRAVENOUS

## 2020-06-10 DIAGNOSIS — N951 Menopausal and female climacteric states: Secondary | ICD-10-CM | POA: Diagnosis not present

## 2020-06-16 DIAGNOSIS — N951 Menopausal and female climacteric states: Secondary | ICD-10-CM | POA: Diagnosis not present

## 2020-06-23 DIAGNOSIS — I10 Essential (primary) hypertension: Secondary | ICD-10-CM | POA: Diagnosis not present

## 2020-06-23 DIAGNOSIS — Z760 Encounter for issue of repeat prescription: Secondary | ICD-10-CM | POA: Diagnosis not present

## 2020-08-04 DIAGNOSIS — E559 Vitamin D deficiency, unspecified: Secondary | ICD-10-CM | POA: Diagnosis not present

## 2020-08-04 DIAGNOSIS — E669 Obesity, unspecified: Secondary | ICD-10-CM | POA: Diagnosis not present

## 2020-08-04 DIAGNOSIS — I10 Essential (primary) hypertension: Secondary | ICD-10-CM | POA: Diagnosis not present

## 2020-08-04 DIAGNOSIS — R600 Localized edema: Secondary | ICD-10-CM | POA: Diagnosis not present

## 2021-01-21 ENCOUNTER — Telehealth: Payer: Managed Care, Other (non HMO) | Admitting: Physician Assistant

## 2021-01-21 ENCOUNTER — Encounter: Payer: Self-pay | Admitting: Physician Assistant

## 2021-01-21 ENCOUNTER — Telehealth: Payer: 59 | Admitting: Physician Assistant

## 2021-01-21 DIAGNOSIS — U071 COVID-19: Secondary | ICD-10-CM

## 2021-01-21 MED ORDER — ALBUTEROL SULFATE HFA 108 (90 BASE) MCG/ACT IN AERS: 2.0000 | INHALATION_SPRAY | Freq: Four times a day (QID) | RESPIRATORY_TRACT | 0 refills | Status: AC | PRN

## 2021-01-21 MED ORDER — BENZONATATE 100 MG PO CAPS: 100.0000 mg | ORAL_CAPSULE | Freq: Three times a day (TID) | ORAL | 0 refills | Status: DC | PRN

## 2021-01-21 MED ORDER — FLUTICASONE PROPIONATE 50 MCG/ACT NA SUSP: 2.0000 | Freq: Every day | NASAL | 0 refills | Status: AC

## 2021-01-21 NOTE — Progress Notes (Signed)
Ms. Judith, Morgan are scheduled for a virtual visit with your provider today.    Just as we do with appointments in the office, we must obtain your consent to participate.  Your consent will be active for this visit and any virtual visit you may have with one of our providers in the next 365 days.    If you have a MyChart account, I can also send a copy of this consent to you electronically.  All virtual visits are billed to your insurance company just like a traditional visit in the office.  As this is a virtual visit, video technology does not allow for your provider to perform a traditional examination.  This may limit your provider's ability to fully assess your condition.  If your provider identifies any concerns that need to be evaluated in person or the need to arrange testing such as labs, EKG, etc, we will make arrangements to do so.    Although advances in technology are sophisticated, we cannot ensure that it will always work on either your end or our end.  If the connection with a video visit is poor, we may have to switch to a telephone visit.  With either a video or telephone visit, we are not always able to ensure that we have a secure connection.   I need to obtain your verbal consent now.   Are you willing to proceed with your visit today?   Judith Morgan has provided verbal consent on 01/21/2021 for a virtual visit (video or telephone).   Judith Daring, PA-C 01/21/2021  4:18 PM    MyChart Video Visit    Virtual Visit via Video Note   This visit type was conducted due to national recommendations for restrictions regarding the COVID-19 Pandemic (e.g. social distancing) in an effort to limit this patient's exposure and mitigate transmission in our community. This patient is at least at moderate risk for complications without adequate follow up. This format is felt to be most appropriate for this patient at this time. Physical exam was limited by quality of the video and  audio technology used for the visit.   Patient location: Home Provider location: Home office in [place]  I discussed the limitations of evaluation and management by telemedicine and the availability of in person appointments. The patient expressed understanding and agreed to proceed.  Patient: Judith Morgan   DOB: 1961/05/23   60 y.o. Female  MRN: [id number] Visit Date: 01/21/2021  Today's healthcare provider: Mar Daring, PA-C   No chief complaint on file.  Subjective    URI  Associated symptoms include congestion, coughing, headaches, sinus pain and a sore throat. Pertinent negatives include no nausea or wheezing.  Judith Morgan is a 60 yr old female that presents today via [facility] for Covid 19. Tested positive today, 01/21/21. She has had markedly elevated temperatures 103.7 and 103.2. She has taken Ibuprofen 200mg . 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 She has completed successful treatment of Hepatitis C in 2018, but still avoids any medications and foods that could harm her liver. She is having a dry cough and myalgias as well. She is vaccinated x 2 (participated in a clinical trial that ended in April 2022). She is due for her booster next month. 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   Patient Active Problem List   Diagnosis Date Noted  . Vitamin D deficiency  06/04/2019  . Prediabetes 05/05/2019  . Cirrhosis ([place]) 09/12/2017  . Chronic hepatitis C without hepatic coma ([place]) 12/12/2016  . Asthma 12/12/2016  . Hypertension 12/12/2016  . Obesity 12/12/2016  . H/O: hysterectomy 12/12/2016   Past Medical History:  Diagnosis Date  . Allergy   . Arthritis   . Asthma   . Back pain   . Food allergy   . GERD (gastroesophageal reflux disease)   . Hepatitis C   . Hypertension   . Knee pain   . Lactose intolerance   . Migraines   . Obesity   . Palpitations   . Vitamin D deficiency        Medications: Outpatient Medications Prior to Visit  Medication Sig  . albuterol (VENTOLIN HFA) 108 (90 Base) MCG/ACT inhaler Inhale 2 puffs into the lungs every 6 (six) hours as needed for wheezing or shortness of breath.  Marland Kitchen amLODipine (NORVASC) 10 MG tablet Take 10 mg by mouth daily.  Marland Kitchen aspirin 81 MG chewable tablet Chew by mouth daily.  . benzonatate (TESSALON) 100 MG capsule Take 1 capsule (100 mg total) by mouth 3 (three) times daily as needed.  . fluticasone (FLONASE) 50 MCG/ACT nasal spray Place 2 sprays into both nostrils daily.  Marland Kitchen ibuprofen (ADVIL,MOTRIN) 200 MG tablet Take 800 mg by mouth every 6 (six) hours as needed for mild pain.   . Magnesium 500 MG CAPS Take by mouth.  . metoprolol succinate (TOPROL-XL) 25 MG 24 hr tablet TAKE 1 TABLET BY MOUTH EVERY DAY FOR BLOOD PRESSURE  . Multiple Vitamin (MULTIVITAMIN WITH MINERALS) TABS tablet Take 1 tablet by mouth daily.  Marland Kitchen spironolactone (ALDACTONE) 25 MG tablet Take by mouth.  . torsemide (DEMADEX) 5 MG tablet Take 5 mg by mouth as needed.   . traMADol (ULTRAM) 50 MG tablet Take 1 tablet (50 mg total) by mouth every 6 (six) hours as needed for moderate pain or severe pain.  . Vitamin D, Ergocalciferol, (DRISDOL) 1.25 MG (50000 UT) CAPS capsule Take 1 capsule (50,000 Units total) by mouth every 7 (seven) days.   Facility-Administered Medications Prior to Visit  Medication Dose Route Frequency Provider  . 0.9 %  sodium chloride infusion  500 mL Intravenous Once Irene Shipper, MD    Review of Systems  Constitutional: Positive for chills, fatigue and fever.  HENT: Positive for congestion, sinus pain and sore throat.   Respiratory: Positive for cough. Negative for chest tightness, shortness of breath and wheezing.   Cardiovascular: Negative.   Gastrointestinal: Negative for nausea.  Neurological: Positive for headaches.    Last CBC Lab Results  Component Value Date   WBC 4.7 11/06/2016   HGB 15.0 11/06/2016   HCT 44.3  11/06/2016   MCV 91.9 11/06/2016   MCH 31.1 11/06/2016   RDW 13.8 11/06/2016   PLT 215 08/65/7846   Last metabolic panel Lab Results  Component Value Date   GLUCOSE 107 (H) 05/04/2019   NA 140 05/04/2019   K 4.3 05/04/2019   CL 102 05/04/2019   CO2 23 05/04/2019   BUN 12 05/04/2019   CREATININE 0.67 05/04/2019   GFRNONAA 98 05/04/2019   GFRAA 113 05/04/2019   CALCIUM 9.4 05/04/2019   PROT 7.8 05/04/2019   ALBUMIN 4.3 05/04/2019   LABGLOB 3.5 05/04/2019   AGRATIO 1.2 05/04/2019   BILITOT 0.3 05/04/2019   ALKPHOS 89 05/04/2019   AST 22 05/04/2019   ALT 22 05/04/2019   ANIONGAP 10 08/15/2016      Objective  There were no vitals taken for this visit. BP Readings from Last 3 Encounters:  08/20/19 112/79  07/28/19 108/70  07/07/19 123/78   Wt Readings from Last 3 Encounters:  08/20/19 214 lb (97.1 kg)  07/28/19 217 lb (98.4 kg)  07/07/19 217 lb (98.4 kg)      Physical Exam Vitals reviewed.  Constitutional:      General: She is not in acute distress.    Appearance: Normal appearance. She is well-developed. She is not ill-appearing.  HENT:     Head: Normocephalic and atraumatic.  Pulmonary:     Effort: Pulmonary effort is normal. No respiratory distress (resting comfortably on her couch, able to speak in full, coherent sentences).  Musculoskeletal:     Cervical back: Normal range of motion and neck supple.  Neurological:     Mental Status: She is alert.  Psychiatric:        Mood and Affect: Mood normal.        Behavior: Behavior normal.        Thought Content: Thought content normal.        Judgment: Judgment normal.        Assessment & Plan     1. COVID-19 - Patient did e-visit as well and has already had medications prescribed  - Continue OTC symptomatic management of choice - Will send OTC vitamins and supplement information through AV[facility] - Patient enrolled in MyChart symptom monitoring - Push fluids - Rest as needed - Referral to Covid  treatment team placed, consult appreciated - Discussed return precautions and when to seek in-person evaluation, sent via AVS as well - Ambulatory referral for Covid Treatment   No follow-ups on file.     I discussed the assessment and treatment plan with the patient. The patient was provided an opportunity to ask questions and all were answered. The patient agreed with the plan and demonstrated an understanding of the instructions.   The patient was advised to call back or seek an in-person evaluation if the symptoms worsen or if the condition fails to improve as anticipated.  I provided 18 minutes of face-to-face time during this encounter via MyChart Video enabled encounter.   [place] [phone] (phone) [phone] (fax)  [place]

## 2021-01-21 NOTE — Progress Notes (Signed)
E-Visit for Positive Covid Test Result We are sorry you are not feeling well. We are here to help!  You have tested positive for COVID-19, meaning that you were infected with the novel coronavirus and could give the virus to others.  It is vitally important that you stay home so you do not spread it to others.      Please continue isolation at home, for at least 10 days since the start of your symptoms and until you have had 24 hours with no fever (without taking a fever reducer) and with improving of symptoms.  If you have no symptoms but tested positive (or all symptoms resolve after 5 days and you have no fever) you can leave your house but continue to wear a mask around others for an additional 5 days. If you have a fever,continue to stay home until you have had 24 hours of no fever. Most cases improve 5-10 days from onset but we have seen a small number of patients who have gotten worse after the 10 days.  Please be sure to watch for worsening symptoms and remain taking the proper precautions.   Go to the nearest hospital ED for assessment if fever/cough/breathlessness are severe or illness seems like a threat to life.    The following symptoms may appear 2-14 days after exposure: . Fever . Cough . Shortness of breath or difficulty breathing . Chills . Repeated shaking with chills . Muscle pain . Headache . Sore throat . New loss of taste or smell . Fatigue . Congestion or runny nose . Nausea or vomiting . Diarrhea  You have been enrolled in [facility] for COVID-19. Daily you will receive a questionnaire within the [facility] website. Our COVID-19 response team will be monitoring your responses daily.  You can use medication such as prescription cough medication called Tessalon Perles 100 mg. You may take 1-2 capsules every 8 hours as needed for cough and  prescription inhaler called Albuterol MDI 90 mcg /actuation 2 puffs every 4 hours as needed for shortness of breath,  wheezing, cough; and Fluticasone nasal spray 2 sprays each nostril daily  You may also take acetaminophen (Tylenol) as needed for fever.  HOME CARE: . Only take medications as instructed by your medical team. . Drink plenty of fluids and get plenty of rest. . A steam or ultrasonic humidifier can help if you have congestion.   GET HELP RIGHT AWAY IF YOU HAVE EMERGENCY WARNING SIGNS.  Call 911 or proceed to your closest emergency facility if: . You develop worsening high fever. . Trouble breathing . Bluish lips or face . Persistent pain or pressure in the chest . New confusion . Inability to wake or stay awake . You cough up blood. . Your symptoms become more severe . Inability to hold down food or fluids  This list is not all possible symptoms. Contact your medical provider for any symptoms that are severe or concerning to you.    Your e-visit answers were reviewed by a board certified advanced clinical practitioner to complete your personal care plan.  Depending on the condition, your plan could have included both over the counter or prescription medications.  If there is a problem please reply once you have received a response from your provider.  Your safety is important to [place].  If you have drug allergies check your prescription carefully.    You can use MyChart to ask questions about today's visit, request a non-urgent call back, or ask  for a work or school excuse for 24 hours related to this e-Visit. If it has been greater than 24 hours you will need to follow up with your provider, or enter a new e-Visit to address those concerns. You will get an e-mail in the next two days asking about your experience.  I hope that your e-visit has been valuable and will speed your recovery. Thank you for using e-visits.  I provided 6 minutes of non face-to-face time during this encounter for chart review and documentation.

## 2021-01-22 ENCOUNTER — Telehealth: Payer: Self-pay

## 2021-01-22 ENCOUNTER — Telehealth: Payer: Self-pay | Admitting: Nurse Practitioner

## 2021-01-22 ENCOUNTER — Encounter (INDEPENDENT_AMBULATORY_CARE_PROVIDER_SITE_OTHER): Payer: Self-pay

## 2021-01-22 NOTE — Telephone Encounter (Signed)
Coughing more, muscle aches worse "feels like been beat up." No SOB. Feels weak. If walks too far gets weak. Advised not take cough med with DM cough suppressant. Clear phlegm and febrile.  If patient has worsening weakness with inability to stand or if patient has to hold on to something to get balance, advise patient to call 911 and seek treatment in ED   . If cough remains the same or better: continue to treat with over the counter medications.  Hard candy or cough drops and drinking warm fluids. Adults can also use honey 2 tsp (10 ML) at bedtime.  Marland Kitchen Honey is not recommended for infants under one. . If cough is becoming worse even with the use of over the counter medications and patient is not able to sleep at night, cough becomes productive with sputum that maybe yellow or green in color, contact PCP.  Marland Kitchen Temperature is the same and is noted to be less than 100.4:  Continue to monitor at home.  . Advise patient to stay hydrated, push oral fluids if able, and advise pt. to avoid using multiple blankets and layer of clothing to prevent overheating.  . Avoid over use of anti-pyretic medications for fevers less than 101 degrees Fahrenheit. . Fever helps fight infection. . Worsening temperature, treat if temperature is > 101:  treat with OTC anti-fever medications (Tylenol and/or Ibuprofen)  . May alternate Tylenol and Ibuprofen every 3 hours as needed for fever greater than 101. Example: Tylenol at 9AM, Ibuprofen at 12PM, Tylenol at 3PM, Ibuprofen at 6PM, Tylenol at 9PM, Ibuprofen at 12AM, Tylenol at 3AM, Ibuprofen at 6AM. Then restart rotation with Tylenol at [place]. .  If fever remains for greater than 3 days, notify PCP. Marland Kitchen  If fever becomes greater than 103 and unable to reduce with over the counter medication, contact PCP.    Pt verbalized understanding.

## 2021-01-22 NOTE — Telephone Encounter (Signed)
Called to discuss with patient about COVID-19 symptoms and the use of one of the available treatments for those with mild to moderate Covid symptoms and at a high risk of hospitalization.  Pt appears to qualify for outpatient treatment due to co-morbid conditions and/or a member of an at-risk group in accordance with the FDA Emergency Use Authorization.    Symptom onset: 01/20/21 Vaccinated: no Booster? no Immunocompromised? no Qualifiers: AA, htn,  NIH Criteria: 3  Patient is speaking with provider Will send message after hearing back. Is interested in Paxlovid.   Alda Lea, NP [facility] [phone]

## 2021-01-23 ENCOUNTER — Encounter: Payer: Self-pay | Admitting: Physician Assistant

## 2021-01-24 ENCOUNTER — Ambulatory Visit: Payer: Self-pay | Admitting: *Deleted

## 2021-01-24 NOTE — Telephone Encounter (Signed)
I called her as a result of the Covid MyChart daily questionnaire indicating a Acupuncturist for vomiting.  See notes below. Reason for Disposition . MILD or MODERATE vomiting (e.g., 1 - 5 times / day)    I went over staying well hydrated per the treatment protocol for the [employer] daily survey.  Answer Assessment - Initial Assessment Questions 1. VOMITING SEVERITY: "How many times have you vomited in the past 24 hours?"     - MILD:  1 - 2 times/day    - MODERATE: 3 - 5 times/day, decreased oral intake without significant weight loss or symptoms of dehydration    - SEVERE: 6 or more times/day, vomits everything or nearly everything, with significant weight loss, symptoms of dehydration      I called and touched base with her after reading her Covid MyChart questionnaire survey regarding her symptoms which triggered a Acupuncturist. Yesterday she vomited after eating some food.   She does not have an appetite with the positive covid so has been drinking protein shakes every 4-6 hours but she thought she would try to eat something but she's not ready for food yet after vomiting.   She will continue drinking her protein shakes for now.  2. ONSET: "When did the vomiting begin?"      Yesterday she vomited on and off for 2 hours.  She felt much better after vomiting.   She went to bed. This morning she vomited again but right now she feels fine  On her stomach after vomiting.  She will drink the protein shakes today. 3. FLUIDS: "What fluids or food have you vomited up today?" "Have you been able to keep any fluids down?"     See above.    She is drinking 6-8 glasses of water a day and some Power Aid to keep hydrated.   She works in the [employer], medical "so I know it's important to remain hydrated". 4. ABDOMINAL PAIN: "Are your having any abdominal pain?" If yes : "How bad is it and what does it feel like?" (e.g., crampy, dull, intermittent, constant)      No 5. DIARRHEA: "Is  there any diarrhea?" If Yes, ask: "How many times today?"      No 6. CONTACTS: "Is there anyone else in the family with the same symptoms?"      No   She is positive for Covid 7. CAUSE: "What do you think is causing your vomiting?"     Covid and trying to eat too soon especially since I don't have an appetite. 8. HYDRATION STATUS: "Any signs of dehydration?" (e.g., dry mouth [not only dry lips], too weak to stand) "When did you last urinate?"     No.    She does get a little winded going up her steps and has to stop and get her breath about 1/2 up.  Otherwise she mentioned the weakness is much improved since Friday and Saturday. 9. OTHER SYMPTOMS: "Do you have any other symptoms?" (e.g., fever, headache, vertigo, vomiting blood or coffee grounds, recent head injury)     She believes her fever has broke.  It's 99.1 this morning and it's been as high as 102.   She is using ibuprofen if it's over 101.5 other wise she is not taking anything to lower the temperature. She noticed that the mucus she is coughing up has become a light thin yellow color.   "I will keep an eye on it and if it  becomes thick dark yellow or green I will certainly call my PCP.  She asked me how to contact the monoclonal antibody infusion clinic.  She is interested in taking an antiviral or the infusion but her window of being within 5 days is growing shorter.   I gave her the number for the infusion clinic and she's going to call it as soon as she gets off the phone with me.    They had contacted her but did not leave a return phone number.  Overall she said she is feeling much better and not as weak.   She thanked me for calling and checking on her and that she would continue doing the daily MyChart Covid survey.   10. PREGNANCY: "Is there any chance you are pregnant?" "When was your last menstrual period?"       *No Answer*  Protocols used: [facility]

## 2021-01-24 NOTE — Telephone Encounter (Signed)
Called pt back and she had already been Rx'd pax by her PCP  Angelena Form PA-C  [facility]

## 2021-01-31 ENCOUNTER — Encounter (INDEPENDENT_AMBULATORY_CARE_PROVIDER_SITE_OTHER): Payer: Self-pay

## 2021-02-01 ENCOUNTER — Telehealth: Payer: Self-pay

## 2021-02-01 NOTE — Telephone Encounter (Signed)
Patient states that she vomited last night and was not able to eat anything. Patient has not had any vomiting today and will try to eat something . Patient advise per protocol and will continue to monitor.    If appetite becomes worse: encourage patient to drink fluids as tolerated, work their way up to bland solid food such as crackers, pretzels, soup, bread or applesauce and boiled starches.   If patient is unable to tolerate any foods or liquids, notify PCP.   IF PATIENT DEVELOPS SEVERE VOMITING (MORE THAN 6 TIMES A DAY AND OR >8 HOURS) AND/OR SEVERE ABDOMINAL PAIN ADVISE PATIENT TO CALL 911 AND SEEK TREATMENT IN ED

## 2021-04-21 ENCOUNTER — Ambulatory Visit (INDEPENDENT_AMBULATORY_CARE_PROVIDER_SITE_OTHER): Payer: Managed Care, Other (non HMO) | Admitting: Pulmonary Disease

## 2021-04-21 ENCOUNTER — Other Ambulatory Visit: Payer: Self-pay

## 2021-04-21 ENCOUNTER — Encounter: Payer: Self-pay | Admitting: Pulmonary Disease

## 2021-04-21 VITALS — BP 128/72 | HR 93 | Temp 98.3°F | Ht 61.0 in | Wt 228.0 lb

## 2021-04-21 DIAGNOSIS — U099 Post covid-19 condition, unspecified: Secondary | ICD-10-CM | POA: Diagnosis not present

## 2021-04-21 DIAGNOSIS — R0602 Shortness of breath: Secondary | ICD-10-CM | POA: Diagnosis not present

## 2021-04-21 NOTE — Patient Instructions (Addendum)
We get a high-resolution CT and PFTs for better evaluation for lung Start an aerobic program at home Follow-up in 2 to 3 months after completion of these tests

## 2021-04-21 NOTE — Progress Notes (Signed)
Judith Morgan    [id number]    November 19, 1960  Primary Care Physician:Brown, Aurora Mask, FNP  Referring Physician: Drue Flirt, MD [phone],  [place] [address]  Chief complaint: Consult for dyspnea, post COVID-19, asthma  HPI: 60 year old with history of childhood asthma, HCV s/p Harvoni Developed COVID-19 in May 2022.  She did not get hospitalized and treated herself at home with paxlovid.  She did note low O2 sats down to 70% at home.  Complains of persistent dyspnea since then.  She has to walk several times while walking from the car to work to get her breath back.  Treated with 7 days of prednisone which improved her symptoms somewhat.  Also prescribed breo by primary care but had to stop after about since she developed hoarseness of voice.  She has history of childhood asthma and is using albuterol as needed.  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  No feather pillows or comforters Smoking history: Never smoker Travel history: Previously lived in [place], [place], [place] and [place].  No significant recent travel Relevant family history: Mother and sister have asthma   Outpatient Encounter Medications as of 04/21/2021  Medication Sig   albuterol (VENTOLIN HFA) 108 (90 Base) MCG/ACT inhaler Inhale 2 puffs into the lungs every 6 (six) hours as needed for wheezing or shortness of breath.   amLODipine (NORVASC) 10 MG tablet Take 10 mg by mouth daily.   aspirin 81 MG chewable tablet Chew by mouth daily.   cetirizine (ZYRTEC) 10 MG tablet Take 10 mg by mouth every morning.   Magnesium 500 MG CAPS Take by mouth.   metoprolol succinate (TOPROL-XL) 25 MG 24 hr tablet TAKE 1 TABLET BY MOUTH EVERY DAY FOR BLOOD PRESSURE   montelukast (SINGULAIR) 10 MG tablet  SMARTSIG:1 Tablet(s) By Mouth Every Evening   Multiple Vitamin (MULTIVITAMIN WITH MINERALS) TABS tablet Take 1 tablet by mouth daily.   progesterone (PROMETRIUM) 100 MG capsule Take by mouth at bedtime.   spironolactone (ALDACTONE) 25 MG tablet Take by mouth.   torsemide (DEMADEX) 5 MG tablet Take 5 mg by mouth as needed.    Vitamin D, Ergocalciferol, (DRISDOL) 1.25 MG (50000 UT) CAPS capsule Take 1 capsule (50,000 Units total) by mouth every 7 (seven) days. (Patient taking differently: Take 50,000 Units by mouth every 30 (thirty) days.)   benzonatate (TESSALON) 100 MG capsule Take 1 capsule (100 mg total) by mouth 3 (three) times daily as needed. (Patient not taking: Reported on 04/21/2021)   fluticasone (FLONASE) 50 MCG/ACT nasal spray Place 2 sprays into both nostrils daily. (Patient not taking: Reported on 04/21/2021)   ibuprofen (ADVIL,MOTRIN) 200 MG tablet Take 800 mg by mouth every 6 (six) hours as needed for mild pain.  (Patient not taking: Reported on 04/21/2021)   traMADol (ULTRAM) 50 MG tablet Take 1 tablet (50 mg total) by mouth every 6 (six) hours as needed for moderate pain or severe pain. (Patient not taking: Reported on 04/21/2021)   Facility-Administered Encounter Medications as of 04/21/2021  Medication   0.9 %  sodium chloride infusion    Allergies as of 04/21/2021 - Review Complete 04/21/2021  Allergen Reaction Noted   Norco [hydrocodone-acetaminophen] Nausea And Vomiting 10/22/2017   Other Itching 08/03/2016    Past Medical History:  Diagnosis Date  Allergy    Arthritis    Asthma    Back pain    Food allergy    GERD (gastroesophageal reflux disease)    Hepatitis C    Hypertension    Knee pain    Lactose intolerance    Migraines    Obesity    Palpitations    Vitamin D deficiency     Past Surgical History:  Procedure Laterality Date   ABDOMINAL HYSTERECTOMY  1997   left ovaries   OVARIAN CYST REMOVAL Right 1992   ruptured cyst   POLYDACTYLY RECONSTRUCTION       Family History  Problem Relation Age of Onset   Diabetes Mother    Heart disease Mother    High blood pressure Mother    High Cholesterol Mother    Eating disorder Mother    Obesity Mother    Diabetes Father    Prostate cancer Father    Heart failure Father    High blood pressure Father    Sleep apnea Father    Heart disease Father    Dementia Maternal Grandmother    Heart failure Paternal Grandmother    Diabetes Maternal Aunt    Stroke Maternal Aunt    Hypertension Maternal Aunt     Social History   Socioeconomic History   Marital status: Soil scientist    Spouse name: Not on file   Number of children: 2   Years of education: Not on file   Highest education level: Not on file  Occupational History   Occupation: community liaison  Tobacco Use   Smoking status: Never   Smokeless tobacco: Never  Vaping Use   Vaping Use: Never used  Substance and Sexual Activity   Alcohol use: No   Drug use: No   Sexual activity: Yes    Partners: Male  Other Topics Concern   Not on file  Social History Narrative   Not on file   Social Determinants of Health   Financial Resource Strain: Not on file  Food Insecurity: Not on file  Transportation Needs: Not on file  Physical Activity: Not on file  Stress: Not on file  Social Connections: Not on file  Intimate Partner Violence: Not on file    Review of systems: Review of Systems  Constitutional: Negative for fever and chills.  HENT: Negative.   Eyes: Negative for blurred vision.  Respiratory: as per HPI  Cardiovascular: Negative for chest pain and palpitations.  Gastrointestinal: Negative for vomiting, diarrhea, blood per rectum. Genitourinary: Negative for dysuria, urgency, frequency and hematuria.  Musculoskeletal: Negative for myalgias, back pain and joint pain.  Skin: Negative for itching and rash.  Neurological: Negative for dizziness, tremors, focal weakness, seizures and loss of consciousness.   Endo/Heme/Allergies: Negative for environmental allergies.  Psychiatric/Behavioral: Negative for depression, suicidal ideas and hallucinations.  All other systems reviewed and are negative.  Physical Exam: Blood pressure 128/72, pulse 93, temperature 98.3 F (36.8 C), temperature source Oral, height '5\' 1"'$  (1.549 m), weight 228 lb (103.4 kg), SpO2 97 %. Gen:      No acute distress HEENT:  EOMI, sclera anicteric Neck:     No masses; no thyromegaly Lungs:    Clear to auscultation bilaterally; normal respiratory effort CV:         Regular rate and rhythm; no murmurs Abd:      + bowel sounds; soft, non-tender; no palpable masses, no distension Ext:    No edema; adequate peripheral perfusion Skin:  Warm and dry; no rash Neuro: alert and oriented x 3 Psych: normal mood and affect  Data Reviewed: Imaging:   PFTs:  Labs:  Assessment:  Asthma, post COVID-19 She has increasing dyspnea after her COVID-19 infection.  This does not sound like worsening of asthma as she has no wheezing and she did not tolerate breo due to hoarseness. We will continue her albuterol as needed for now as she is not interested in starting another inhaler  We will get pulm function testing high-resolution CT for evaluation of post-COVID pneumonitis, ILD Suspect she has deconditioning and dyspnea from body habitus Discussed weight loss and exercise to help with recovery from COVID We discussed pulmonary rehab but she wants to hold off for now  Plan/Recommendations: High-resolution CT, PFTs Weight loss, diet exercise and exercise program  Marshell Garfinkel MD [facility] 04/21/2021, 4:29 PM  CC: Drue Flirt, MD

## 2021-05-12 ENCOUNTER — Other Ambulatory Visit: Payer: Managed Care, Other (non HMO)

## 2021-06-19 ENCOUNTER — Telehealth: Payer: Self-pay | Admitting: Pulmonary Disease

## 2021-06-19 NOTE — Telephone Encounter (Signed)
Pt is calling and stated that her insurance denied the HR CT scan that was ordered.  She is scheduled for PFT and OV with PM on 10/20 and she wanted to see if PM still wanted her to keep these appts.  thanks

## 2021-06-21 NOTE — Telephone Encounter (Signed)
Yes. Please keep the appointments as we can review the PFTs Have they been cancelled?

## 2021-06-21 NOTE — Telephone Encounter (Signed)
I reviewed her chart, it appears the appts have been cancelled. Attempted to see if I could get the appts back at the same times but other patients have been scheduled in the OV and PFT slots.

## 2021-06-22 ENCOUNTER — Ambulatory Visit: Payer: Managed Care, Other (non HMO) | Admitting: Pulmonary Disease

## 2021-07-10 NOTE — Telephone Encounter (Signed)
Please reschedule PFTs and office visit

## 2021-07-12 NOTE — Telephone Encounter (Signed)
Called and spoke with Patient. Patient scheduled 08/30/21 at 1100 for PFT and follow up OV with Dr. Vaughan Browner 09/07/21 at 1:45pm.

## 2021-08-30 ENCOUNTER — Ambulatory Visit (INDEPENDENT_AMBULATORY_CARE_PROVIDER_SITE_OTHER): Payer: Managed Care, Other (non HMO) | Admitting: Pulmonary Disease

## 2021-08-30 ENCOUNTER — Other Ambulatory Visit: Payer: Self-pay

## 2021-08-30 DIAGNOSIS — R0602 Shortness of breath: Secondary | ICD-10-CM

## 2021-08-30 LAB — PULMONARY FUNCTION TEST
DL/VA % pred: 87 %
DL/VA: 3.78 ml/min/mmHg/L
DLCO cor % pred: 86 %
DLCO cor: 15.74 ml/min/mmHg
DLCO unc % pred: 86 %
DLCO unc: 15.74 ml/min/mmHg
FEF 25-75 Post: 3.94 L/sec
FEF 25-75 Pre: 3.05 L/sec
FEF2575-%Change-Post: 29 %
FEF2575-%Pred-Post: 207 %
FEF2575-%Pred-Pre: 160 %
FEV1-%Change-Post: 8 %
FEV1-%Pred-Post: 135 %
FEV1-%Pred-Pre: 125 %
FEV1-Post: 2.51 L
FEV1-Pre: 2.32 L
FEV1FVC-%Change-Post: 0 %
FEV1FVC-%Pred-Pre: 107 %
FEV6-%Change-Post: 8 %
FEV6-%Pred-Post: 129 %
FEV6-%Pred-Pre: 118 %
FEV6-Post: 2.93 L
FEV6-Pre: 2.7 L
FEV6FVC-%Pred-Post: 103 %
FEV6FVC-%Pred-Pre: 103 %
FVC-%Change-Post: 8 %
FVC-%Pred-Post: 124 %
FVC-%Pred-Pre: 114 %
FVC-Post: 2.93 L
FVC-Pre: 2.7 L
Post FEV1/FVC ratio: 86 %
Post FEV6/FVC ratio: 100 %
Pre FEV1/FVC ratio: 86 %
Pre FEV6/FVC Ratio: 100 %
RV % pred: 90 %
RV: 1.65 L
TLC % pred: 102 %
TLC: 4.74 L

## 2021-08-30 NOTE — Progress Notes (Signed)
Full PFT completed today ? ?

## 2021-09-07 ENCOUNTER — Ambulatory Visit (INDEPENDENT_AMBULATORY_CARE_PROVIDER_SITE_OTHER): Payer: Managed Care, Other (non HMO) | Admitting: Pulmonary Disease

## 2021-09-07 ENCOUNTER — Other Ambulatory Visit: Payer: Self-pay

## 2021-09-07 ENCOUNTER — Encounter: Payer: Self-pay | Admitting: Pulmonary Disease

## 2021-09-07 VITALS — BP 120/70 | HR 96 | Temp 98.6°F | Ht 61.0 in | Wt 244.0 lb

## 2021-09-07 DIAGNOSIS — R0602 Shortness of breath: Secondary | ICD-10-CM | POA: Diagnosis not present

## 2021-09-07 NOTE — Progress Notes (Signed)
Judith Morgan    [id number]    December 15, 1960  Primary Care Physician:Brown, Aurora Mask, FNP  Referring Physician: Mardi Mainland, [address] [place],  [place] [address]  Chief complaint: Follow-up for dyspnea, post COVID-19, asthma  HPI: 61 year old with history of childhood asthma, HCV s/p Harvoni Developed COVID-19 in May 2022.  She did not get hospitalized and treated herself at home with paxlovid.  She did note low O2 sats down to 70% at home.  Complains of persistent dyspnea since then.  She has to walk several times while walking from the car to work to get her breath back.  Treated with 7 days of prednisone which improved her symptoms somewhat.  Also prescribed breo by primary care but had to stop after about since she developed hoarseness of voice.  She has history of childhood asthma and is using albuterol as needed.  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  No feather pillows or comforters Smoking history: Never smoker Travel history: Previously lived in [place], [place], [place] and [place].  No significant recent travel Relevant family history: Mother and sister have asthma  Interim history: She is here for review of PFTs.  States that she has occasional dyspnea on exertion but otherwise doing well  High-res CT ordered at last visit but patient did not go through as it was not covered by insurance  Outpatient Encounter Medications as of 09/07/2021  Medication Sig   albuterol (VENTOLIN HFA) 108 (90 Base) MCG/ACT inhaler Inhale 2 puffs into the lungs every 6 (six) hours as needed for wheezing or shortness of breath.   amLODipine (NORVASC) 10 MG tablet Take 10 mg by mouth daily.   aspirin 81 MG chewable tablet Chew by mouth daily.   benzonatate (TESSALON)  100 MG capsule Take 1 capsule (100 mg total) by mouth 3 (three) times daily as needed. (Patient not taking: Reported on 04/21/2021)   cetirizine (ZYRTEC) 10 MG tablet Take 10 mg by mouth every morning.   fluticasone (FLONASE) 50 MCG/ACT nasal spray Place 2 sprays into both nostrils daily. (Patient not taking: Reported on 04/21/2021)   ibuprofen (ADVIL,MOTRIN) 200 MG tablet Take 800 mg by mouth every 6 (six) hours as needed for mild pain.  (Patient not taking: Reported on 04/21/2021)   Magnesium 500 MG CAPS Take by mouth.   metoprolol succinate (TOPROL-XL) 25 MG 24 hr tablet TAKE 1 TABLET BY MOUTH EVERY DAY FOR BLOOD PRESSURE   montelukast (SINGULAIR) 10 MG tablet SMARTSIG:1 Tablet(s) By Mouth Every Evening   Multiple Vitamin (MULTIVITAMIN WITH MINERALS) TABS tablet Take 1 tablet by mouth daily.   progesterone (PROMETRIUM) 100 MG capsule Take by mouth at bedtime.   spironolactone (ALDACTONE) 25 MG tablet Take by mouth.   torsemide (DEMADEX) 5 MG tablet Take 5 mg by mouth as needed. Takes 5 mg daily and takes 10mg  as needed   traMADol (ULTRAM) 50 MG tablet Take 1 tablet (50 mg total) by mouth every 6 (six) hours as needed for moderate pain or severe pain. (Patient not taking: Reported on 04/21/2021)   Vitamin D, Ergocalciferol, (DRISDOL) 1.25 MG (50000 UT) CAPS capsule Take 1 capsule (50,000 Units total) by mouth every 7 (seven) days. (Patient taking differently: Take 50,000 Units by mouth every 30 (thirty) days.)   Facility-Administered Encounter Medications  as of 09/07/2021  Medication   0.9 %  sodium chloride infusion    Physical Exam: Blood pressure 128/72, pulse 93, temperature 98.3 F (36.8 C), temperature source Oral, height 5\' 1"  (1.549 m), weight 228 lb (103.4 kg), SpO2 97 %. Gen:      No acute distress HEENT:  EOMI, sclera anicteric Neck:     No masses; no thyromegaly Lungs:    Clear to auscultation bilaterally; normal respiratory effort CV:         Regular rate and rhythm; no  murmurs Abd:      + bowel sounds; soft, non-tender; no palpable masses, no distension Ext:    No edema; adequate peripheral perfusion Skin:      Warm and dry; no rash Neuro: alert and oriented x 3 Psych: normal mood and affect  Data Reviewed: Imaging:  PFTs: 08/30/2021 FVC 2.93 [124%], FEV1 2.51 [135%], F/F 86, TLC 4.74 [102%], DLCO 15.74 [86%] Normal test  Labs:   Assessment:  Asthma, post COVID-19 She has increasing dyspnea after her COVID-19 infection.  This does not sound like worsening of asthma as she has no wheezing and she did not tolerate breo due to hoarseness. We will continue her albuterol as needed for now as she is not interested in starting another inhaler  PFTs reviewed with normal lung function.  I do not suspect that she has any post-COVID ILD. She did not go through with a high-res CT of the chest due to insurance coverage issues and does not want to re consider imaging Suspect she has deconditioning and dyspnea from body habitus Discussed weight loss and exercise  We discussed pulmonary rehab but she wants to hold off for now  Return to clinic as needed  Plan/Recommendations: Weight loss, diet exercise and exercise program Return as needed  Marshell Garfinkel MD [facility] 09/07/2021, 1:54 PM  CC: Mardi Mainland,*

## 2021-09-07 NOTE — Patient Instructions (Signed)
I am glad you are doing well with the breathing.  PFTs show normal lung function You can return to pulmonary clinic as needed.

## 2021-10-11 ENCOUNTER — Encounter: Payer: Self-pay | Admitting: *Deleted

## 2021-10-21 IMAGING — MG DIGITAL SCREENING BILAT W/ TOMO W/ CAD
8 series · 8 of 24 positions shown · non-contrast
Comparison: None.

ACR Breast Density Category a: The breast tissue is almost entirely
fatty.

CLINICAL DATA: Screening.

EXAM:
DIGITAL SCREENING BILATERAL MAMMOGRAM WITH TOMO AND CAD

[R MLO synth-2D]
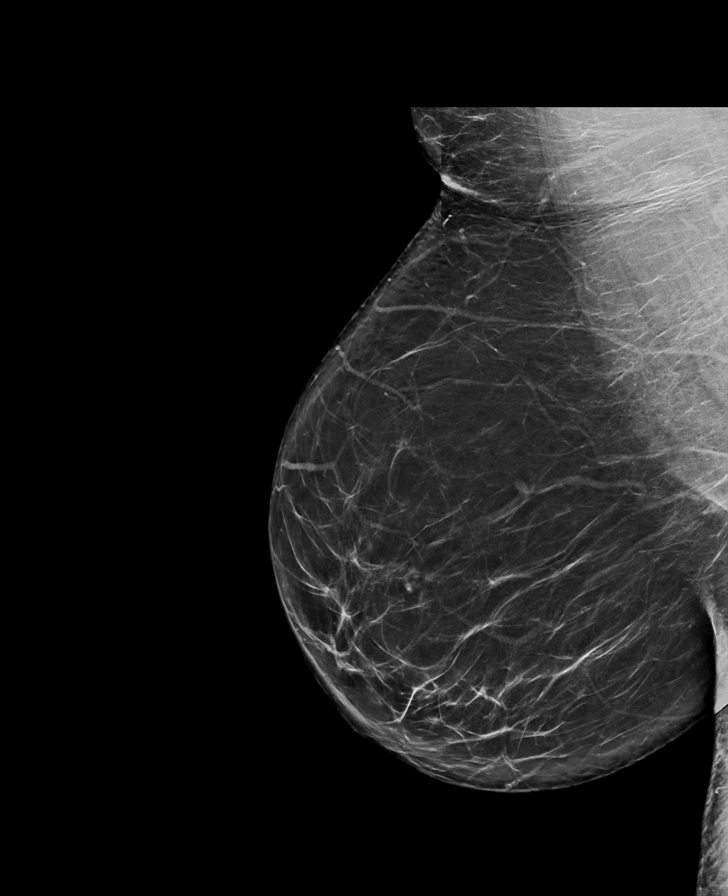

[L CC synth-2D]
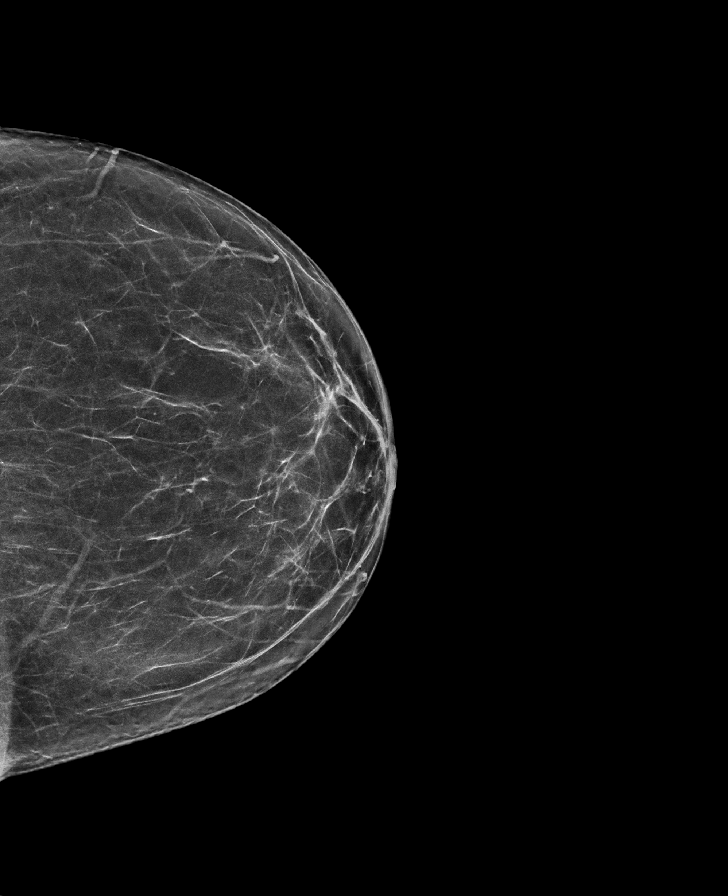

[L MLO synth-2D]
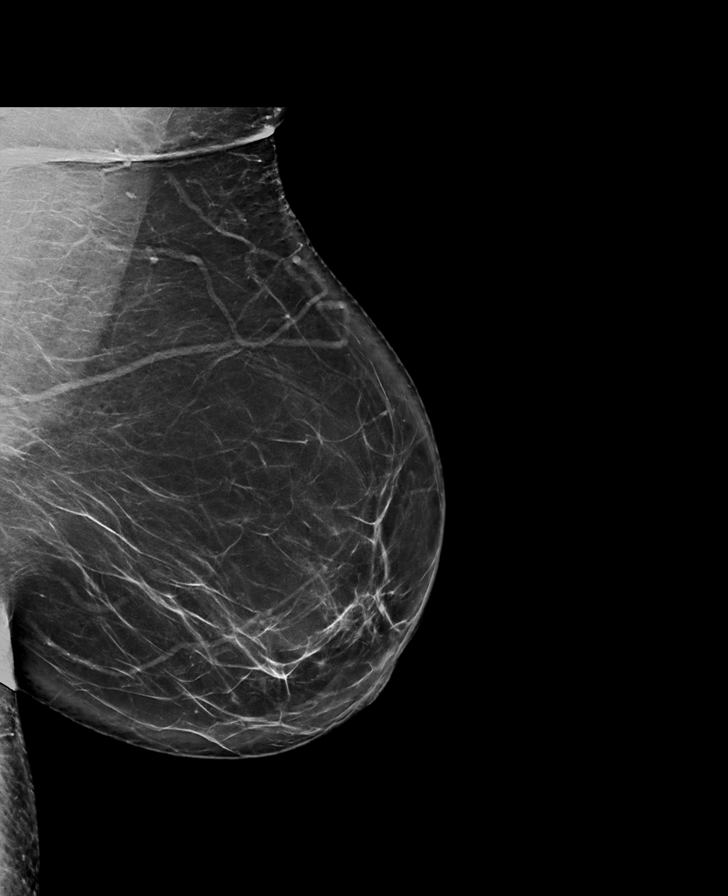

[R CC synth-2D]
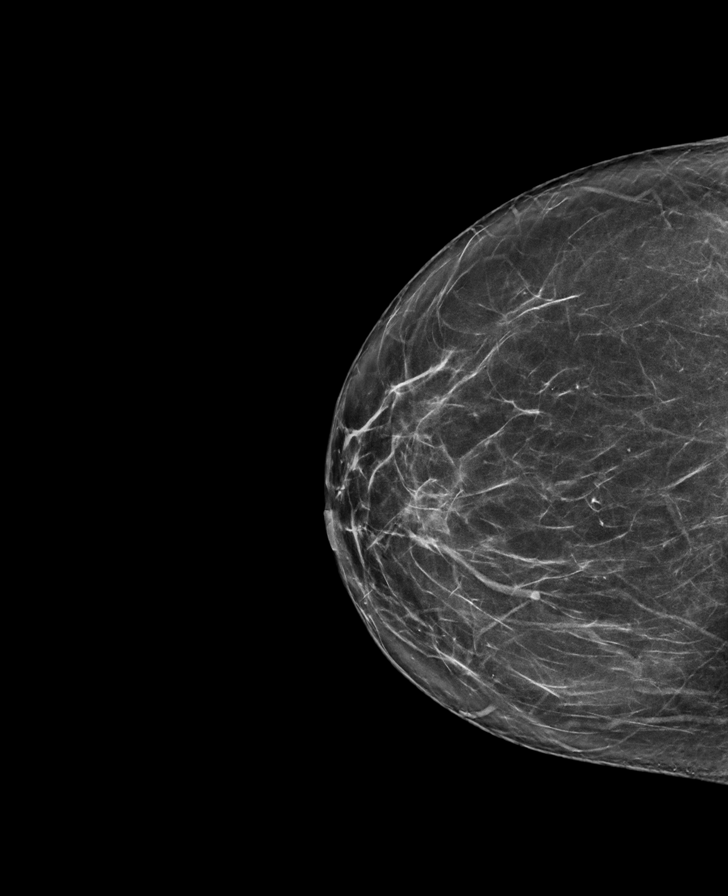

[R CC tomo · tomo slice 37/74.0]
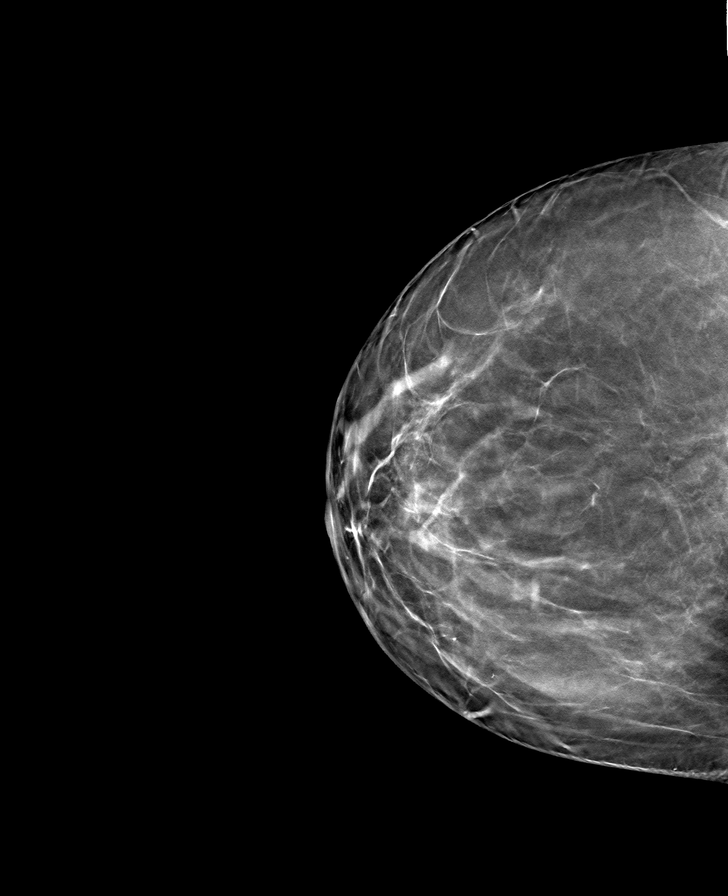

[R MLO tomo · tomo slice 45/88.0]
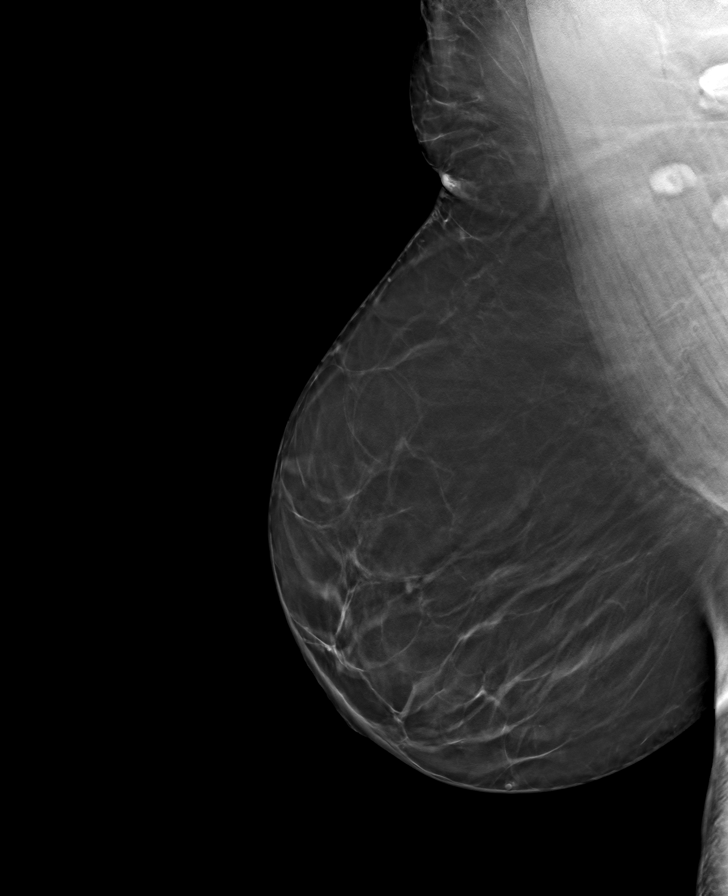

[L CC tomo · tomo slice 35/68.0]
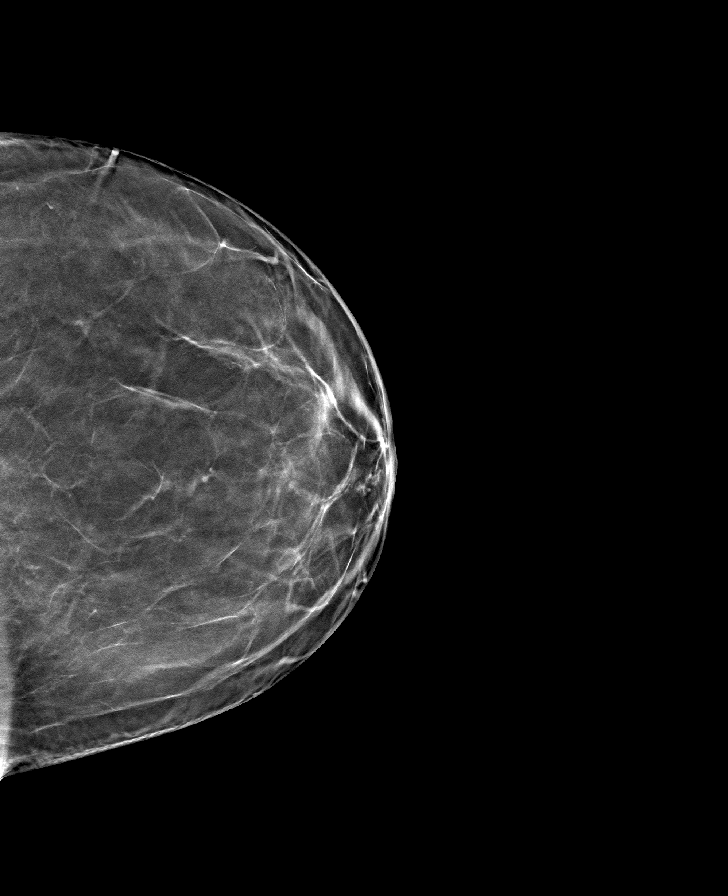

[L MLO tomo · tomo slice 45/89.0]
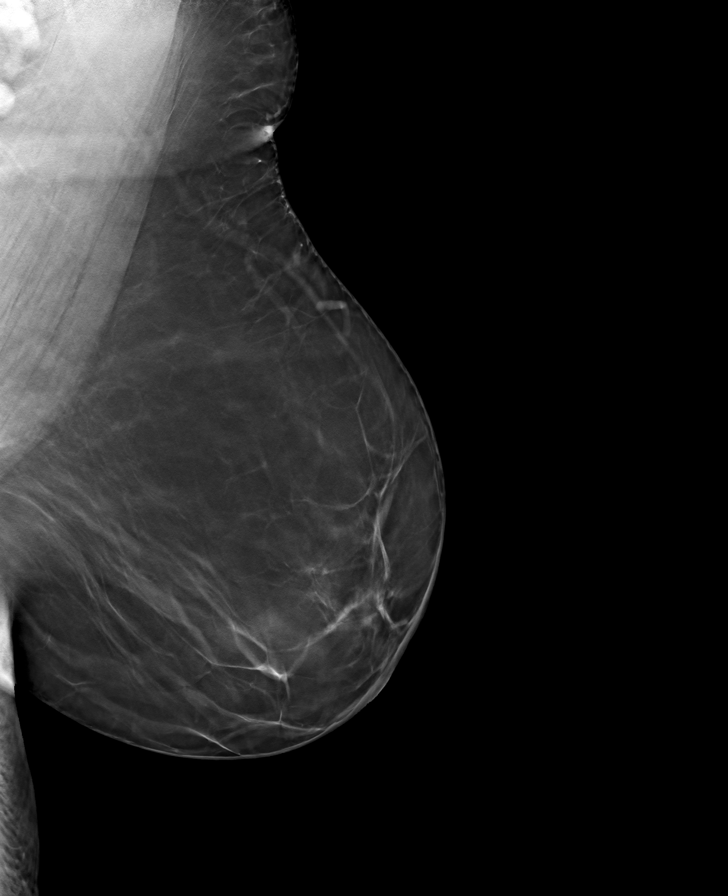

[8 of 24 positions shown; findings below may reference images not displayed]

FINDINGS: There are no findings suspicious for malignancy. Images were
processed with CAD.
IMPRESSION: No mammographic evidence of malignancy. A result letter of this
screening mammogram will be mailed directly to the patient.

RECOMMENDATION:
Screening mammogram in one year. (Code:[id number]-[id number])

BI-RADS CATEGORY  1: Negative.

## 2021-11-27 ENCOUNTER — Encounter: Payer: Self-pay | Admitting: Cardiology

## 2021-11-27 ENCOUNTER — Ambulatory Visit (INDEPENDENT_AMBULATORY_CARE_PROVIDER_SITE_OTHER): Payer: Managed Care, Other (non HMO) | Admitting: Cardiology

## 2021-11-27 VITALS — BP 120/70 | HR 94 | Ht 61.0 in | Wt 240.8 lb

## 2021-11-27 DIAGNOSIS — R600 Localized edema: Secondary | ICD-10-CM | POA: Diagnosis not present

## 2021-11-27 DIAGNOSIS — I10 Essential (primary) hypertension: Secondary | ICD-10-CM | POA: Diagnosis not present

## 2021-11-27 DIAGNOSIS — R0609 Other forms of dyspnea: Secondary | ICD-10-CM

## 2021-11-27 DIAGNOSIS — R0602 Shortness of breath: Secondary | ICD-10-CM

## 2021-11-27 NOTE — Patient Instructions (Addendum)
Medication Instructions:  ?Continue all current medications. ? ?Labwork: ?BMET, MG, BNP - orders given today ? ?Testing/Procedures: ?Your physician has requested that you have an echocardiogram. Echocardiography is a painless test that uses sound waves to create images of your heart. It provides your doctor with information about the size and shape of your heart and how well your heart?s chambers and valves are working. This procedure takes approximately one hour. There are no restrictions for this procedure. ? ?Follow-Up: ?Office will contact with results via phone, letter or mychart.    ?3 months  ? ?Any Other Special Instructions Will Be Listed Below (If Applicable). ? ? ?If you need a refill on your cardiac medications before your next appointment, please call your pharmacy. ? ?

## 2021-11-27 NOTE — Progress Notes (Signed)
? ? ? ?Clinical Summary ?Ms. Nappi is a 61 y.o.female seen as a new consult, referred by NP Owens Shark for the following medical problems. ? ?1.DOE ?- followed by pulmonary ?- COVID in 01/2021, dyspnea since that time.  ?- from pulm note normal PFTs. Insurance would not cover high rest CT ?- thought secondary to deconditioning ? ? ?-report paternal grandmother with with HF in her 7s ?- swelling increased after COVID  ?-DOE with 1 flight of stairs.  ?- bilateral LE edema. She is on torsemide prn, has had to make more often. Essentially takes torsemide 10 mg daily.  ?- no chest pains.  ? ? ? ?2. HTN ?- pcp had stopped norvasc, unclear if improved swelling ?- difficult to control bp off norvasc, it was restarted ? ? ? ? ?SH: works as Merchandiser, retail ? ? ? ?Past Medical History:  ?Diagnosis Date  ? Allergy   ? Arthritis   ? Asthma   ? Back pain   ? Food allergy   ? GERD (gastroesophageal reflux disease)   ? Hepatitis C   ? Hypertension   ? Knee pain   ? Lactose intolerance   ? Migraines   ? Obesity   ? Palpitations   ? Vitamin D deficiency   ? ? ? ?Allergies  ?Allergen Reactions  ? Norco [Hydrocodone-Acetaminophen] Nausea And Vomiting  ?  Vicodin and that class of meds ?  ? Other Itching  ?  Red peppers  ? ? ? ?Current Outpatient Medications  ?Medication Sig Dispense Refill  ? albuterol (VENTOLIN HFA) 108 (90 Base) MCG/ACT inhaler Inhale 2 puffs into the lungs every 6 (six) hours as needed for wheezing or shortness of breath. 8 g 0  ? amLODipine (NORVASC) 10 MG tablet Take 10 mg by mouth daily.    ? aspirin 81 MG chewable tablet Chew by mouth daily.    ? benzonatate (TESSALON) 100 MG capsule Take 1 capsule (100 mg total) by mouth 3 (three) times daily as needed. (Patient not taking: Reported on 04/21/2021) 30 capsule 0  ? cetirizine (ZYRTEC) 10 MG tablet Take 10 mg by mouth every morning.    ? fluticasone (FLONASE) 50 MCG/ACT nasal spray Place 2 sprays into both nostrils daily. (Patient not taking: Reported on 04/21/2021) 16 g  0  ? ibuprofen (ADVIL,MOTRIN) 200 MG tablet Take 800 mg by mouth every 6 (six) hours as needed for mild pain.  (Patient not taking: Reported on 04/21/2021)    ? Magnesium 500 MG CAPS Take by mouth.    ? metoprolol succinate (TOPROL-XL) 25 MG 24 hr tablet TAKE 1 TABLET BY MOUTH EVERY DAY FOR BLOOD PRESSURE  5  ? montelukast (SINGULAIR) 10 MG tablet SMARTSIG:1 Tablet(s) By Mouth Every Evening    ? Multiple Vitamin (MULTIVITAMIN WITH MINERALS) TABS tablet Take 1 tablet by mouth daily.    ? progesterone (PROMETRIUM) 100 MG capsule Take by mouth at bedtime.    ? spironolactone (ALDACTONE) 25 MG tablet Take by mouth.    ? torsemide (DEMADEX) 5 MG tablet Take 5 mg by mouth as needed. Takes 5 mg daily and takes '10mg'$  as needed    ? traMADol (ULTRAM) 50 MG tablet Take 1 tablet (50 mg total) by mouth every 6 (six) hours as needed for moderate pain or severe pain. (Patient not taking: Reported on 04/21/2021) 20 tablet 0  ? Vitamin D, Ergocalciferol, (DRISDOL) 1.25 MG (50000 UT) CAPS capsule Take 1 capsule (50,000 Units total) by mouth every 7 (seven) days. (Patient  taking differently: Take 50,000 Units by mouth every 30 (thirty) days.) 5 capsule 1  ? ?Current Facility-Administered Medications  ?Medication Dose Route Frequency Provider Last Rate Last Admin  ? 0.9 %  sodium chloride infusion  500 mL Intravenous Once Irene Shipper, MD      ? ? ? ?Past Surgical History:  ?Procedure Laterality Date  ? ABDOMINAL HYSTERECTOMY  1997  ? left ovaries  ? OVARIAN CYST REMOVAL Right 1992  ? ruptured cyst  ? POLYDACTYLY RECONSTRUCTION    ? ? ? ?Allergies  ?Allergen Reactions  ? Norco [Hydrocodone-Acetaminophen] Nausea And Vomiting  ?  Vicodin and that class of meds ?  ? Other Itching  ?  Red peppers  ? ? ? ? ?Family History  ?Problem Relation Age of Onset  ? Diabetes Mother   ? Heart disease Mother   ? High blood pressure Mother   ? High Cholesterol Mother   ? Eating disorder Mother   ? Obesity Mother   ? Diabetes Father   ? Prostate cancer  Father   ? Heart failure Father   ? High blood pressure Father   ? Sleep apnea Father   ? Heart disease Father   ? Dementia Maternal Grandmother   ? Heart failure Paternal Grandmother   ? Diabetes Maternal Aunt   ? Stroke Maternal Aunt   ? Hypertension Maternal Aunt   ? ? ? ?Social History ?Ms. Boehning reports that she has never smoked. She has never used smokeless tobacco. ?Ms. Tedesco reports no history of alcohol use. ? ? ?Review of Systems ?CONSTITUTIONAL: No weight loss, fever, chills, weakness or fatigue.  ?HEENT: Eyes: No visual loss, blurred vision, double vision or yellow sclerae.No hearing loss, sneezing, congestion, runny nose or sore throat.  ?SKIN: No rash or itching.  ?CARDIOVASCULAR: per hpi ?RESPIRATORY: No shortness of breath, cough or sputum.  ?GASTROINTESTINAL: No anorexia, nausea, vomiting or diarrhea. No abdominal pain or blood.  ?GENITOURINARY: No burning on urination, no polyuria ?NEUROLOGICAL: No headache, dizziness, syncope, paralysis, ataxia, numbness or tingling in the extremities. No change in bowel or bladder control.  ?MUSCULOSKELETAL: No muscle, back pain, joint pain or stiffness.  ?LYMPHATICS: No enlarged nodes. No history of splenectomy.  ?PSYCHIATRIC: No history of depression or anxiety.  ?ENDOCRINOLOGIC: No reports of sweating, cold or heat intolerance. No polyuria or polydipsia.  ?. ? ? ?Physical Examination ?Today's Vitals  ? 11/27/21 1458  ?BP: 120/70  ?Pulse: 94  ?SpO2: 98%  ?Weight: 240 lb 12.8 oz (109.2 kg)  ?Height: '5\' 1"'$  (1.549 m)  ? ?Body mass index is 45.5 kg/m?. ? ?Gen: resting comfortably, no acute distress ?HEENT: no scleral icterus, pupils equal round and reactive, no palptable cervical adenopathy,  ?CV: RRR, 2/6 systolic murmur apex, no jvd ?Resp: Clear to auscultation bilaterally ?GI: abdomen is soft, non-tender, non-distended, normal bowel sounds, no hepatosplenomegaly ?MSK: extremities are warm, no edema.  ?Skin: warm, no rash ?Neuro:  no focal deficits ?Psych:  appropriate affect ? ? ? ? ?Assessment and Plan  ?1.DOE/LE edema ?- will plan for echo to further evaluate cardiac function ?- has been taking torsemide daily, will recheck bmet/mg/bnp. Room to titrate if needed ? ?2. HTN ?- at goal, continue current meds ? ?EKG today shows NSR ? ?F/u 3 months ? ? ? ? ? ?Arnoldo Lenis, M.D., ?

## 2022-01-02 ENCOUNTER — Ambulatory Visit (INDEPENDENT_AMBULATORY_CARE_PROVIDER_SITE_OTHER): Payer: BC Managed Care – PPO

## 2022-01-02 DIAGNOSIS — R0602 Shortness of breath: Secondary | ICD-10-CM | POA: Diagnosis not present

## 2022-01-02 LAB — ECHOCARDIOGRAM COMPLETE
AR max vel: 2.85 cm2
AV Area VTI: 2.77 cm2
AV Area mean vel: 2.55 cm2
AV Mean grad: 6 mmHg
AV Peak grad: 10.1 mmHg
Ao pk vel: 1.59 m/s
Area-P 1/2: 3.03 cm2
Calc EF: 67.6 %
S' Lateral: 2.37 cm
Single Plane A2C EF: 68.4 %
Single Plane A4C EF: 67.3 %

## 2022-01-05 ENCOUNTER — Other Ambulatory Visit (HOSPITAL_COMMUNITY)
Admission: RE | Admit: 2022-01-05 | Discharge: 2022-01-05 | Disposition: A | Discharge status: Home / Self Care | Payer: BC Managed Care – PPO | Source: Ambulatory Visit | Attending: Cardiology | Admitting: Cardiology

## 2022-01-05 DIAGNOSIS — R600 Localized edema: Secondary | ICD-10-CM | POA: Diagnosis not present

## 2022-01-05 DIAGNOSIS — I10 Essential (primary) hypertension: Secondary | ICD-10-CM | POA: Diagnosis present

## 2022-01-05 DIAGNOSIS — R0602 Shortness of breath: Secondary | ICD-10-CM

## 2022-01-05 LAB — MAGNESIUM: Magnesium: 2 mg/dL (ref 1.7–2.4)

## 2022-01-05 LAB — BRAIN NATRIURETIC PEPTIDE: B Natriuretic Peptide: 20 pg/mL (ref 0.0–100.0)

## 2022-01-05 LAB — BASIC METABOLIC PANEL
Anion gap: 7 (ref 5–15)
BUN: 13 mg/dL (ref 6–20)
CO2: 25 mmol/L (ref 22–32)
Calcium: 9 mg/dL (ref 8.9–10.3)
Chloride: 106 mmol/L (ref 98–111)
Creatinine, Ser: 0.78 mg/dL (ref 0.44–1.00)
GFR, Estimated: >60 mL/min (ref >60)
Glucose, Bld: 99 mg/dL (ref 70–99)
Potassium: 3.9 mmol/L (ref 3.5–5.1)
Sodium: 138 mmol/L (ref 135–145)

## 2022-03-14 ENCOUNTER — Ambulatory Visit: Payer: Managed Care, Other (non HMO) | Admitting: Cardiology

## 2022-03-14 NOTE — Progress Notes (Deleted)
Clinical Summary Ms. Minichiello is a 61 y.o.female  1.DOE - followed by pulmonary - COVID in 01/2021, dyspnea since that time.  - from pulm note normal PFTs. Insurance would not cover high rest CT - thought secondary to deconditioning     -report paternal grandmother with with HF in her 8s - swelling increased after [place] with 1 flight of stairs.  - bilateral LE edema. She is on torsemide prn, has had to make more often. Essentially takes torsemide 10 mg daily.  - no chest pains.     01/2022 echo: LVEF 85-46%, indet diastolic, normal RV  10/7033 Cr 0.67 BUN 12 BNP 20   Pfts> 2. HTN - pcp had stopped norvasc, unclear if improved swelling - difficult to control bp off norvasc, it was restarted         SH: works as Merchandiser, retail Past Medical History:  Diagnosis Date   Allergy    Arthritis    Asthma    Back pain    Food allergy    GERD (gastroesophageal reflux disease)    Hepatitis C    Hypertension    Knee pain    Lactose intolerance    Migraines    Obesity    Palpitations    Vitamin D deficiency      Allergies  Allergen Reactions   Norco [Hydrocodone-Acetaminophen] Nausea And Vomiting    Vicodin and that class of meds    Other Itching    Red peppers     Current Outpatient Medications  Medication Sig Dispense Refill   albuterol (VENTOLIN HFA) 108 (90 Base) MCG/ACT inhaler Inhale 2 puffs into the lungs every 6 (six) hours as needed for wheezing or shortness of breath. 8 g 0   amLODipine (NORVASC) 10 MG tablet Take 10 mg by mouth daily.     aspirin 81 MG chewable tablet Chew by mouth daily.     cetirizine (ZYRTEC) 10 MG tablet Take 10 mg by mouth every morning.     fluticasone (FLONASE) 50 MCG/ACT nasal spray Place 2 sprays into both nostrils daily. 16 g 0   ibuprofen (ADVIL,MOTRIN) 200 MG tablet Take 800 mg by mouth every 6 (six) hours as needed for mild pain.     Magnesium 500 MG CAPS Take 1 capsule by mouth daily.     metoprolol succinate  (TOPROL-XL) 25 MG 24 hr tablet TAKE 1 TABLET BY MOUTH EVERY DAY FOR BLOOD PRESSURE  5   montelukast (SINGULAIR) 10 MG tablet SMARTSIG:1 Tablet(s) By Mouth Every Evening     Multiple Vitamin (MULTIVITAMIN WITH MINERALS) TABS tablet Take 1 tablet by mouth daily.     progesterone (PROMETRIUM) 100 MG capsule Take by mouth at bedtime.     spironolactone (ALDACTONE) 25 MG tablet Take 25 mg by mouth daily.     torsemide (DEMADEX) 10 MG tablet Take 10 mg by mouth daily as needed (swelling).     traMADol (ULTRAM) 50 MG tablet Take 1 tablet (50 mg total) by mouth every 6 (six) hours as needed for moderate pain or severe pain. 20 tablet 0   Vitamin D, Ergocalciferol, (DRISDOL) 1.25 MG (50000 UT) CAPS capsule Take 1 capsule (50,000 Units total) by mouth every 7 (seven) days. (Patient taking differently: Take 50,000 Units by mouth every 30 (thirty) days.) 5 capsule 1   Current Facility-Administered Medications  Medication Dose Route Frequency Provider Last Rate Last Admin   0.9 %  sodium chloride infusion  500 mL Intravenous Once  Irene Shipper, MD         Past Surgical History:  Procedure Laterality Date   ABDOMINAL HYSTERECTOMY  1997   left ovaries   OVARIAN CYST REMOVAL Right 1992   ruptured cyst   POLYDACTYLY RECONSTRUCTION       Allergies  Allergen Reactions   Norco [Hydrocodone-Acetaminophen] Nausea And Vomiting    Vicodin and that class of meds    Other Itching    Red peppers      Family History  Problem Relation Age of Onset   Diabetes Mother    Heart disease Mother    High blood pressure Mother    High Cholesterol Mother    Eating disorder Mother    Obesity Mother    Diabetes Father    Prostate cancer Father    Heart failure Father    High blood pressure Father    Sleep apnea Father    Heart disease Father    Dementia Maternal Grandmother    Heart failure Paternal Grandmother    Diabetes Maternal Aunt    Stroke Maternal Aunt    Hypertension Maternal Aunt       Social History Ms. Wessells reports that she has never smoked. She has never used smokeless tobacco. Ms. Widmann reports no history of alcohol use.   Review of Systems CONSTITUTIONAL: No weight loss, fever, chills, weakness or fatigue.  HEENT: Eyes: No visual loss, blurred vision, double vision or yellow sclerae.No hearing loss, sneezing, congestion, runny nose or sore throat.  SKIN: No rash or itching.  CARDIOVASCULAR:  RESPIRATORY: No shortness of breath, cough or sputum.  GASTROINTESTINAL: No anorexia, nausea, vomiting or diarrhea. No abdominal pain or blood.  GENITOURINARY: No burning on urination, no polyuria NEUROLOGICAL: No headache, dizziness, syncope, paralysis, ataxia, numbness or tingling in the extremities. No change in bowel or bladder control.  MUSCULOSKELETAL: No muscle, back pain, joint pain or stiffness.  LYMPHATICS: No enlarged nodes. No history of splenectomy.  PSYCHIATRIC: No history of depression or anxiety.  ENDOCRINOLOGIC: No reports of sweating, cold or heat intolerance. No polyuria or polydipsia.  Marland Kitchen   Physical Examination There were no vitals filed for this visit. There were no vitals filed for this visit.  Gen: resting comfortably, no acute distress HEENT: no scleral icterus, pupils equal round and reactive, no palptable cervical adenopathy,  CV Resp: Clear to auscultation bilaterally GI: abdomen is soft, non-tender, non-distended, normal bowel sounds, no hepatosplenomegaly MSK: extremities are warm, no edema.  Skin: warm, no rash Neuro:  no focal deficits Psych: appropriate affect   Diagnostic Studies  01/2022 echo IMPRESSIONS     1. Left ventricular ejection fraction, by estimation, is 65 to 70%. The  left ventricle has normal function. The left ventricle has no regional  wall motion abnormalities. Left ventricular diastolic parameters are  indeterminate.   2. Right ventricular systolic function is normal. The right ventricular  size is  normal. Tricuspid regurgitation signal is inadequate for assessing  PA pressure.   3. The mitral valve is grossly normal. Trivial mitral valve  regurgitation.   4. The aortic valve is tricuspid. Aortic valve regurgitation is not  visualized. No aortic stenosis is present. Aortic valve mean gradient  measures 6.0 mmHg.   5. The inferior vena cava is normal in size with greater than 50%  respiratory variability, suggesting right atrial pressure of 3 mmHg.    Assessment and Plan   1.DOE/LE edema - will plan for echo to further evaluate cardiac function -  has been taking torsemide daily, will recheck bmet/mg/bnp. Room to titrate if needed   2. HTN - at goal, continue current meds   EKG today shows NSR   F/u 3 months     Arnoldo Lenis, M.D., F.A.C.C.

## 2022-03-15 ENCOUNTER — Encounter: Payer: Self-pay | Admitting: Cardiology

## 2022-04-11 ENCOUNTER — Encounter (INDEPENDENT_AMBULATORY_CARE_PROVIDER_SITE_OTHER): Payer: Self-pay

## 2022-06-21 ENCOUNTER — Ambulatory Visit: Payer: BC Managed Care – PPO | Attending: Cardiology | Admitting: Cardiology

## 2022-06-21 ENCOUNTER — Encounter: Payer: Self-pay | Admitting: Cardiology

## 2022-06-21 VITALS — BP 124/76 | HR 83 | Ht 61.0 in | Wt 230.4 lb

## 2022-06-21 DIAGNOSIS — R0609 Other forms of dyspnea: Secondary | ICD-10-CM

## 2022-06-21 DIAGNOSIS — R600 Localized edema: Secondary | ICD-10-CM

## 2022-06-21 DIAGNOSIS — I10 Essential (primary) hypertension: Secondary | ICD-10-CM

## 2022-06-21 NOTE — Patient Instructions (Signed)
Medication Instructions:  Continue all current medications.  Labwork: none  Testing/Procedures: none  Follow-Up: As needed.    Any Other Special Instructions Will Be Listed Below (If Applicable).  If you need a refill on your cardiac medications before your next appointment, please call your pharmacy.  

## 2022-06-21 NOTE — Progress Notes (Signed)
Clinical Summary Judith Morgan is a 61 y.o.female seen today for follow up of the following medical problems.  1.DOE/LE edema - followed by pulmonary - COVID in 01/2021, dyspnea since that time.  - from pulm note normal PFTs. Insurance would not cover high rest CT - thought secondary to deconditioning     -report paternal grandmother with with HF in her 51s - swelling increased after [place] with 1 Morgan of stairs.  - bilateral LE edema. She is on torsemide prn, has had to make more often. Essentially takes torsemide 10 mg daily.  - no chest pains.     01/2022 echo LVEF 95-09%, indet diastolic, normal atial size, normal RV - on torsemide  - swelling has improved since last visit - SOB has improved as well - weight loss 14 lbs since Jan 2023 - has torsemide '10mg'$   and takes prn, takes about 1-2 times per week.     2. HTN - pcp had stopped norvasc, unclear if improved swelling - difficult to control bp off norvasc, it was restarted - compliant with meds         SH: works as Merchandiser, retail   Past Medical History:  Diagnosis Date   Allergy    Arthritis    Asthma    Back pain    Food allergy    GERD (gastroesophageal reflux disease)    Hepatitis C    Hypertension    Knee pain    Lactose intolerance    Migraines    Obesity    Palpitations    Vitamin D deficiency      Allergies  Allergen Reactions   Norco [Hydrocodone-Acetaminophen] Nausea And Vomiting    Vicodin and that class of meds    Other Itching    Red peppers     Current Outpatient Medications  Medication Sig Dispense Refill   albuterol (VENTOLIN HFA) 108 (90 Base) MCG/ACT inhaler Inhale 2 puffs into the lungs every 6 (six) hours as needed for wheezing or shortness of breath. 8 g 0   amLODipine (NORVASC) 10 MG tablet Take 10 mg by mouth daily.     aspirin 81 MG chewable tablet Chew by mouth daily.     cetirizine (ZYRTEC) 10 MG tablet Take 10 mg by mouth every morning.     fluticasone  (FLONASE) 50 MCG/ACT nasal spray Place 2 sprays into both nostrils daily. 16 g 0   ibuprofen (ADVIL,MOTRIN) 200 MG tablet Take 800 mg by mouth every 6 (six) hours as needed for mild pain.     Magnesium 500 MG CAPS Take 1 capsule by mouth daily.     metoprolol succinate (TOPROL-XL) 25 MG 24 hr tablet TAKE 1 TABLET BY MOUTH EVERY DAY FOR BLOOD PRESSURE  5   montelukast (SINGULAIR) 10 MG tablet SMARTSIG:1 Tablet(s) By Mouth Every Evening     Multiple Vitamin (MULTIVITAMIN WITH MINERALS) TABS tablet Take 1 tablet by mouth daily.     progesterone (PROMETRIUM) 100 MG capsule Take by mouth at bedtime.     spironolactone (ALDACTONE) 25 MG tablet Take 25 mg by mouth daily.     torsemide (DEMADEX) 10 MG tablet Take 10 mg by mouth daily as needed (swelling).     traMADol (ULTRAM) 50 MG tablet Take 1 tablet (50 mg total) by mouth every 6 (six) hours as needed for moderate pain or severe pain. 20 tablet 0   Vitamin D, Ergocalciferol, (DRISDOL) 1.25 MG (50000 UT) CAPS capsule Take 1  capsule (50,000 Units total) by mouth every 7 (seven) days. (Patient taking differently: Take 50,000 Units by mouth every 30 (thirty) days.) 5 capsule 1   Current Facility-Administered Medications  Medication Dose Route Frequency Provider Last Rate Last Admin   0.9 %  sodium chloride infusion  500 mL Intravenous Once Irene Shipper, MD         Past Surgical History:  Procedure Laterality Date   ABDOMINAL HYSTERECTOMY  1997   left ovaries   OVARIAN CYST REMOVAL Right 1992   ruptured cyst   POLYDACTYLY RECONSTRUCTION       Allergies  Allergen Reactions   Norco [Hydrocodone-Acetaminophen] Nausea And Vomiting    Vicodin and that class of meds    Other Itching    Red peppers      Family History  Problem Relation Age of Onset   Diabetes Mother    Heart disease Mother    High blood pressure Mother    High Cholesterol Mother    Eating disorder Mother    Obesity Mother    Diabetes Father    Prostate cancer Father     Heart failure Father    High blood pressure Father    Sleep apnea Father    Heart disease Father    Dementia Maternal Grandmother    Heart failure Paternal Grandmother    Diabetes Maternal Aunt    Stroke Maternal Aunt    Hypertension Maternal Aunt      Social History Ms. Schuman reports that she has never smoked. She has never used smokeless tobacco. Ms. Pudlo reports no history of alcohol use.   Review of Systems CONSTITUTIONAL: No weight loss, fever, chills, weakness or fatigue.  HEENT: Eyes: No visual loss, blurred vision, double vision or yellow sclerae.No hearing loss, sneezing, congestion, runny nose or sore throat.  SKIN: No rash or itching.  CARDIOVASCULAR: per hpi RESPIRATORY: No shortness of breath, cough or sputum.  GASTROINTESTINAL: No anorexia, nausea, vomiting or diarrhea. No abdominal pain or blood.  GENITOURINARY: No burning on urination, no polyuria NEUROLOGICAL: No headache, dizziness, syncope, paralysis, ataxia, numbness or tingling in the extremities. No change in bowel or bladder control.  MUSCULOSKELETAL: No muscle, back pain, joint pain or stiffness.  LYMPHATICS: No enlarged nodes. No history of splenectomy.  PSYCHIATRIC: No history of depression or anxiety.  ENDOCRINOLOGIC: No reports of sweating, cold or heat intolerance. No polyuria or polydipsia.  Marland Kitchen   Physical Examination Today's Vitals   06/21/22 1516  BP: 124/76  Pulse: 83  SpO2: 96%  Weight: 230 lb 6.4 oz (104.5 kg)  Height: '5\' 1"'$  (1.549 m)   Body mass index is 43.53 kg/m.  Gen: resting comfortably, no acute distress HEENT: no scleral icterus, pupils equal round and reactive, no palptable cervical adenopathy,  CV: RRR, no m/rg no jvd Resp: Clear to auscultation bilaterally GI: abdomen is soft, non-tender, non-distended, normal bowel sounds, no hepatosplenomegaly MSK: extremities are warm, no edema.  Skin: warm, no rash Neuro:  no focal deficits Psych: appropriate  affect   Diagnostic Studies  01/2022 echo IMPRESSIONS     1. Left ventricular ejection fraction, by estimation, is 65 to 70%. The  left ventricle has normal function. The left ventricle has no regional  wall motion abnormalities. Left ventricular diastolic parameters are  indeterminate.   2. Right ventricular systolic function is normal. The right ventricular  size is normal. Tricuspid regurgitation signal is inadequate for assessing  PA pressure.   3. The mitral valve is grossly  normal. Trivial mitral valve  regurgitation.   4. The aortic valve is tricuspid. Aortic valve regurgitation is not  visualized. No aortic stenosis is present. Aortic valve mean gradient  measures 6.0 mmHg.   5. The inferior vena cava is normal in size with greater than 50%  respiratory variability, suggesting right atrial pressure of 3 mmHg.    Assessment and Plan  1.DOE/LE edema - benign echo - symptoms have resolved, no further workup at this time - can continue prn torsemide.    2. HTN - bp is at goal, continue current meds  F/u as needed      Arnoldo Lenis, M.D.

## 2022-07-31 DIAGNOSIS — G8929 Other chronic pain: Secondary | ICD-10-CM | POA: Insufficient documentation

## 2022-08-30 ENCOUNTER — Ambulatory Visit: Payer: BC Managed Care – PPO | Admitting: Cardiology

## 2022-12-31 ENCOUNTER — Encounter: Payer: Self-pay | Admitting: Internal Medicine

## 2023-01-14 ENCOUNTER — Encounter: Payer: Self-pay | Admitting: Internal Medicine

## 2023-01-31 ENCOUNTER — Ambulatory Visit (AMBULATORY_SURGERY_CENTER): Payer: BC Managed Care – PPO

## 2023-01-31 ENCOUNTER — Encounter: Payer: Self-pay | Admitting: Internal Medicine

## 2023-01-31 VITALS — Ht 61.0 in | Wt 230.0 lb

## 2023-01-31 DIAGNOSIS — Z86010 Personal history of colonic polyps: Secondary | ICD-10-CM

## 2023-01-31 MED ORDER — NA SULFATE-K SULFATE-MG SULF 17.5-3.13-1.6 GM/177ML PO SOLN: 1.0000 | Freq: Once | ORAL | 0 refills | Status: AC

## 2023-01-31 NOTE — Progress Notes (Signed)
No egg or soy allergy known to patient  No issues known to pt with past sedation with any surgeries or procedures Patient denies ever being told they had issues or difficulty with intubation  No FH of Malignant Hyperthermia Pt is not on diet pills Pt is not on  home 02  Pt is not on blood thinners  Pt has issues with constipation  No A fib or A flutter Have any cardiac testing pending--no Pt instructed to use [email or web address] or GoodRx for a price reduction on prep  Can ambulate without assistance

## 2023-02-05 ENCOUNTER — Other Ambulatory Visit: Payer: Self-pay | Admitting: Nurse Practitioner

## 2023-02-05 DIAGNOSIS — Z1231 Encounter for screening mammogram for malignant neoplasm of breast: Secondary | ICD-10-CM

## 2023-02-07 ENCOUNTER — Ambulatory Visit (HOSPITAL_BASED_OUTPATIENT_CLINIC_OR_DEPARTMENT_OTHER)
Admission: RE | Admit: 2023-02-07 | Discharge: 2023-02-07 | Disposition: A | Discharge status: Home / Self Care | Payer: BC Managed Care – PPO | Source: Ambulatory Visit | Attending: Nurse Practitioner | Admitting: Nurse Practitioner

## 2023-02-07 DIAGNOSIS — Z1231 Encounter for screening mammogram for malignant neoplasm of breast: Secondary | ICD-10-CM | POA: Diagnosis not present

## 2023-02-13 ENCOUNTER — Ambulatory Visit (AMBULATORY_SURGERY_CENTER): Payer: BC Managed Care – PPO | Admitting: Internal Medicine

## 2023-02-13 ENCOUNTER — Encounter: Payer: Self-pay | Admitting: Internal Medicine

## 2023-02-13 VITALS — BP 129/66 | HR 75 | Temp 98.0°F | Resp 12 | Ht 61.0 in | Wt 233.0 lb

## 2023-02-13 DIAGNOSIS — Z86010 Personal history of colonic polyps: Secondary | ICD-10-CM

## 2023-02-13 DIAGNOSIS — Z09 Encounter for follow-up examination after completed treatment for conditions other than malignant neoplasm: Secondary | ICD-10-CM

## 2023-02-13 MED ORDER — PANTOPRAZOLE SODIUM 40 MG PO TBEC: 40.0000 mg | DELAYED_RELEASE_TABLET | Freq: Every day | ORAL | 11 refills | Status: AC

## 2023-02-13 MED ORDER — SODIUM CHLORIDE 0.9 % IV SOLN: 500.0000 mL | Freq: Once | INTRAVENOUS | Status: DC

## 2023-02-13 NOTE — Progress Notes (Signed)
Pt's states no medical or surgical changes since previsit or office visit. 

## 2023-02-13 NOTE — Progress Notes (Signed)
HISTORY OF PRESENT ILLNESS:  Judith Morgan is a 62 y.o. female with a personal history of adenomatous colon polyp.  Now for surveillance colonoscopy  REVIEW OF SYSTEMS:  All non-GI ROS negative except for  Past Medical History:  Diagnosis Date   Allergy    Asthma    Back pain    Food allergy    GERD (gastroesophageal reflux disease)    Hepatitis C    Hypertension    Knee pain    Lactose intolerance    Migraines    Obesity    Palpitations    Vitamin D deficiency     Past Surgical History:  Procedure Laterality Date   ABDOMINAL HYSTERECTOMY  1997   left ovaries   OVARIAN CYST REMOVAL Right 1992   ruptured cyst   POLYDACTYLY RECONSTRUCTION      Social History Judith Morgan  reports that she has never smoked. She has never used smokeless tobacco. She reports that she does not drink alcohol and does not use drugs.  family history includes Dementia in her maternal grandmother; Diabetes in her father, maternal aunt, and mother; Eating disorder in her mother; Esophageal cancer in her paternal aunt and paternal uncle; Heart disease in her father and mother; Heart failure in her father and paternal grandmother; High Cholesterol in her mother; High blood pressure in her father and mother; Hypertension in her maternal aunt; Obesity in her mother; Prostate cancer in her father; Rectal cancer in her maternal aunt and maternal uncle; Sleep apnea in her father; Stomach cancer in her maternal aunt and maternal uncle; Stroke in her maternal aunt.  Allergies  Allergen Reactions   Cat Hair Extract Swelling   Norco [Hydrocodone-Acetaminophen] Nausea And Vomiting    Vicodin and that class of meds    Dust Mite Extract    Other Itching    Red peppers   Pollen Extract        PHYSICAL EXAMINATION: Vital signs: BP 133/69   Pulse 86   Temp 98 F (36.7 C)   Ht 5\' 1"  (1.549 m)   Wt 233 lb (105.7 kg)   SpO2 99%   BMI 44.02 kg/m  General: Well-developed, well-nourished, no  acute distress HEENT: Sclerae are anicteric, conjunctiva pink. Oral mucosa intact Lungs: Clear Heart: Regular Abdomen: soft, nontender, nondistended, no obvious ascites, no peritoneal signs, normal bowel sounds. No organomegaly. Extremities: No edema Psychiatric: alert and oriented x3. Cooperative     ASSESSMENT:   History of adenomatous polyp  PLAN:  Surveillance colonoscopy

## 2023-02-13 NOTE — Progress Notes (Signed)
Sedate, gd SR, tolerated procedure well, VSS, report to RN 

## 2023-02-13 NOTE — Op Note (Signed)
[facility] Patient Name: Judith Morgan Procedure Date: 02/13/2023 4:56 PM MRN: [id number] Endoscopist: Wilhemina Bonito. Marina Goodell , MD, [id number] Age: 62 Referring MD:  Date of Birth: July 16, 1961 Gender: Female Account #: [id number] Procedure:                Colonoscopy Indications:              High risk colon cancer surveillance: Personal                            history of non-advanced adenoma Medicines:                Monitored Anesthesia Care Procedure:                Pre-Anesthesia Assessment:                           - Prior to the procedure, a History and Physical                            was performed, and patient medications and                            allergies were reviewed. The patient's tolerance of                            previous anesthesia was also reviewed. The risks                            and benefits of the procedure and the sedation                            options and risks were discussed with the patient.                            All questions were answered, and informed consent                            was obtained. Prior Anticoagulants: The patient has                            taken no anticoagulant or antiplatelet agents.                            After reviewing the risks and benefits, the patient                            was deemed in satisfactory condition to undergo the                            procedure.                           After obtaining informed consent, the colonoscope  was passed under direct vision. Throughout the                            procedure, the patient's blood pressure, pulse, and                            oxygen saturations were monitored continuously. The                            Olympus CF-HQ190L [id number]) Colonoscope was                            introduced through the anus and advanced to the the                            cecum, identified by appendiceal orifice and                             ileocecal valve. The ileocecal valve, appendiceal                            orifice, and rectum were photographed. The quality                            of the bowel preparation was excellent. The                            colonoscopy was performed without difficulty. The                            patient tolerated the procedure well. The bowel                            preparation used was SUPREP via split dose                            instruction. Scope In: 5:04:11 PM Scope Out: 5:13:58 PM Scope Withdrawal Time: 0 hours 7 minutes 30 seconds  Total Procedure Duration: 0 hours 9 minutes 47 seconds  Findings:                 Many diverticula were found in the entire colon.                           The exam was otherwise without abnormality on                            direct and retroflexion views. Complications:            No immediate complications. Estimated blood loss:                            None. Estimated Blood Loss:     Estimated blood loss: none. Impression:               - Diverticulosis in the entire  examined colon.                           - The examination was otherwise normal on direct                            and retroflexion views.                           - No specimens collected. Recommendation:           - Repeat colonoscopy in 10 years for surveillance.                           - Patient has a contact number available for                            emergencies. The signs and symptoms of potential                            delayed complications were discussed with the                            patient. Return to normal activities tomorrow.                            Written discharge instructions were provided to the                            patient.                           - Resume previous diet.                           - Continue present medications. Wilhemina Bonito. Marina Goodell, MD 02/13/2023 5:17:43 PM This report has been signed  electronically.

## 2023-02-13 NOTE — Patient Instructions (Signed)
   Handout on diverticulosis given to you today    YOU HAD AN ENDOSCOPIC PROCEDURE TODAY AT THE [facility]:   Refer to the procedure report that was given to you for any specific questions about what was found during the examination.  If the procedure report does not answer your questions, please call your gastroenterologist to clarify.  If you requested that your care partner not be given the details of your procedure findings, then the procedure report has been included in a sealed envelope for you to review at your convenience later.  YOU SHOULD EXPECT: Some feelings of bloating in the abdomen. Passage of more gas than usual.  Walking can help get rid of the air that was put into your GI tract during the procedure and reduce the bloating. If you had a lower endoscopy (such as a colonoscopy or flexible sigmoidoscopy) you may notice spotting of blood in your stool or on the toilet paper. If you underwent a bowel prep for your procedure, you may not have a normal bowel movement for a few days.  Please Note:  You might notice some irritation and congestion in your nose or some drainage.  This is from the oxygen used during your procedure.  There is no need for concern and it should clear up in a day or so.  SYMPTOMS TO REPORT IMMEDIATELY:  Following lower endoscopy (colonoscopy or flexible sigmoidoscopy):  Excessive amounts of blood in the stool  Significant tenderness or worsening of abdominal pains  Swelling of the abdomen that is new, acute  Fever of 100F or higher   For urgent or emergent issues, a gastroenterologist can be reached at any hour by calling [phone]. Do not use MyChart messaging for urgent concerns.    DIET:  We do recommend a small meal at first, but then you may proceed to your regular diet.  Drink plenty of fluids but you should avoid alcoholic beverages for 24 hours.  ACTIVITY:  You should plan to take it easy for the rest of today and you should  NOT DRIVE or use heavy machinery until tomorrow (because of the sedation medicines used during the test).    FOLLOW UP: Our staff will call the number listed on your records the next business day following your procedure.  We will call around 7:15- 8:00 am to check on you and address any questions or concerns that you may have regarding the information given to you following your procedure. If we do not reach you, we will leave a message.     If any biopsies were taken you will be contacted by phone or by letter within the next 1-3 weeks.  Please call us at [phone] if you have not heard about the biopsies in 3 weeks.    SIGNATURES/CONFIDENTIALITY: You and/or your care partner have signed paperwork which will be entered into your electronic medical record.  These signatures attest to the fact that that the information above on your After Visit Summary has been reviewed and is understood.  Full responsibility of the confidentiality of this discharge information lies with you and/or your care-partner. 

## 2023-02-14 ENCOUNTER — Telehealth: Payer: Self-pay

## 2023-02-14 DIAGNOSIS — M3500 Sicca syndrome, unspecified: Secondary | ICD-10-CM | POA: Insufficient documentation

## 2023-02-14 DIAGNOSIS — H16123 Filamentary keratitis, bilateral: Secondary | ICD-10-CM | POA: Insufficient documentation

## 2023-02-14 NOTE — Telephone Encounter (Signed)
  Follow up Call-     02/13/2023    3:12 PM  Call back number  Post procedure Call Back phone  # [phone]  Permission to leave phone message Yes     Patient questions:  Do you have a fever, pain , or abdominal swelling? No. Pain Score  0 *  Have you tolerated food without any problems? Yes.    Have you been able to return to your normal activities? Yes.    Do you have any questions about your discharge instructions: Diet   No. Medications  No. Follow up visit  No.  Do you have questions or concerns about your Care? No.  Actions: * If pain score is 4 or above: No action needed, pain <4.

## 2023-02-15 ENCOUNTER — Other Ambulatory Visit (HOSPITAL_COMMUNITY): Payer: Self-pay

## 2023-02-15 ENCOUNTER — Telehealth: Payer: Self-pay

## 2023-02-15 NOTE — Telephone Encounter (Signed)
Patient Advocate Encounter   Received notification that prior authorization for Pantoprazole Sodium 40MG  dr tablets is required.   PA submitted on 02/15/23 [facility]  Status is pending awaiting questions.

## 2023-02-21 ENCOUNTER — Other Ambulatory Visit (HOSPITAL_COMMUNITY): Payer: Self-pay

## 2023-02-21 NOTE — Telephone Encounter (Signed)
Patient Advocate Encounter  Prior Authorization for Pantoprazole Sodium 40MG  dr tablets has been approved.    PA#  [id number] Insurance [employer] [place] [employer] Effective dates: 02/20/23 through 02/19/24

## 2023-05-09 ENCOUNTER — Other Ambulatory Visit: Payer: Self-pay | Admitting: *Deleted

## 2023-05-09 DIAGNOSIS — M25662 Stiffness of left knee, not elsewhere classified: Secondary | ICD-10-CM

## 2023-05-09 DIAGNOSIS — S86912D Strain of unspecified muscle(s) and tendon(s) at lower leg level, left leg, subsequent encounter: Secondary | ICD-10-CM

## 2023-05-09 DIAGNOSIS — M2392 Unspecified internal derangement of left knee: Secondary | ICD-10-CM

## 2023-05-20 ENCOUNTER — Ambulatory Visit
Admission: RE | Admit: 2023-05-20 | Discharge: 2023-05-20 | Disposition: A | Discharge status: Home / Self Care | Payer: BC Managed Care – PPO | Source: Ambulatory Visit | Attending: *Deleted | Admitting: *Deleted

## 2023-05-20 DIAGNOSIS — M25662 Stiffness of left knee, not elsewhere classified: Secondary | ICD-10-CM

## 2023-05-20 DIAGNOSIS — S86912D Strain of unspecified muscle(s) and tendon(s) at lower leg level, left leg, subsequent encounter: Secondary | ICD-10-CM

## 2023-05-20 DIAGNOSIS — M2392 Unspecified internal derangement of left knee: Secondary | ICD-10-CM

## 2024-01-09 ENCOUNTER — Other Ambulatory Visit: Payer: Self-pay | Admitting: Nurse Practitioner

## 2024-01-09 DIAGNOSIS — Z780 Asymptomatic menopausal state: Secondary | ICD-10-CM

## 2024-01-09 DIAGNOSIS — Z1231 Encounter for screening mammogram for malignant neoplasm of breast: Secondary | ICD-10-CM

## 2024-02-10 ENCOUNTER — Ambulatory Visit

## 2024-02-21 ENCOUNTER — Ambulatory Visit

## 2024-04-07 ENCOUNTER — Ambulatory Visit

## 2024-06-19 ENCOUNTER — Encounter: Admitting: Internal Medicine

## 2024-07-08 ENCOUNTER — Ambulatory Visit

## 2024-07-10 ENCOUNTER — Ambulatory Visit: Attending: Internal Medicine | Admitting: Internal Medicine

## 2024-07-10 ENCOUNTER — Encounter: Payer: Self-pay | Admitting: Internal Medicine

## 2024-07-10 VITALS — BP 109/70 | HR 75 | Temp 97.8°F | Resp 16 | Ht 61.0 in | Wt 205.4 lb

## 2024-07-10 DIAGNOSIS — M3501 Sicca syndrome with keratoconjunctivitis: Secondary | ICD-10-CM | POA: Diagnosis not present

## 2024-07-10 DIAGNOSIS — R7689 Other specified abnormal immunological findings in serum: Secondary | ICD-10-CM | POA: Diagnosis not present

## 2024-07-10 NOTE — Patient Instructions (Signed)
 I recommend symptom treatments for eye dryness including lubricating eye drops and can use gel or ointment based products for overnight.  Also consider use of humidifier at night during dry weather.  Use follow-up regularly with your eye doctor.  If symptoms worsen there are several types of medicated eyedrop that can help with the dryness or inflammation.

## 2024-07-10 NOTE — Progress Notes (Signed)
 Office Visit Note  Patient: Judith Morgan             Date of Birth: [birth year]           MRN: [id number]             PCP: Delores Delorise Lunger, FNP Referring: Delores Delorise Lunger,* Visit Date: 07/10/2024 Occupation: Data Unavailable  Subjective:  New Patient (Initial Visit) (Eye Dr believes her to have Sjorgens ) and Abnormal Lab   Discussed the use of AI scribe software for clinical note transcription with the patient, who gave verbal consent to proceed.  History of Present Illness   Judith Morgan is a 63 year old female who presents for evaluation of abnormal tests including positive ANA and elevated sedimentation rate.  She has experienced chronic dry eyes since the early 2000s. An eye doctor previously suggested Sjogren's syndrome and recommended eye plugs, which she declined. She has used Restasis which initially relieve symptoms but later cause discomfort, leading her to stop using them after a few months. She consistently uses Refresh Plus eye drops. Recently, she noticed a new floater in her vision of the left eye since November 1st, described as 'something dancing' in her field of vision.  No dry mouth unless she does not drink her usual eight glasses of water daily. No swelling in her tear, saliva glands, or lymph nodes. No skin rashes or changes.  She has a history of a knee injury from July 3rd of the previous year, resulting in a partial tear of the cruciate ligament and a wavy ACL. She underwent physical therapy and continues exercises but experiences instability and pain, especially when climbing stairs.  She has a family history of lupus, with her sister being diagnosed last year. No other family history of autoimmune diseases.  She experiences varicose veins and swelling during long travel, for which she uses compression socks. She also has a recurrent corn between her toes since childhood.       Labs reviewed 09/2023 ANA pos RF neg CCP  neg ESR 52  Imaging reviewed 05/27/23 MRI Left Knee IMPRESSION: 1. ACL has a wavy appearance with relative attenuation of the ACL insertion concerning for a chronic partial thickness tear. 2. Partial-thickness cartilage loss of the medial femorotibial compartment consistent with mild to moderate osteoarthritis.  Activities of Daily Living:  Patient reports morning stiffness for  none.   Patient Reports nocturnal pain.  Difficulty dressing/grooming: Denies Difficulty climbing stairs: Reports Difficulty getting out of chair: Denies Difficulty using hands for taps, buttons, cutlery, and/or writing: Denies  Review of Systems  Constitutional:  Positive for fatigue.  HENT:  Negative for mouth sores and mouth dryness.   Eyes:  Positive for dryness.  Respiratory:  Negative for shortness of breath.   Cardiovascular:  Negative for chest pain and palpitations.  Gastrointestinal:  Positive for constipation. Negative for blood in stool and diarrhea.  Endocrine: Negative for increased urination.  Genitourinary:  Negative for involuntary urination.  Musculoskeletal:  Positive for joint pain and joint pain. Negative for gait problem, joint swelling, myalgias, muscle weakness, morning stiffness, muscle tenderness and myalgias.  Skin:  Positive for color change and hair loss. Negative for rash and sensitivity to sunlight.  Allergic/Immunologic: Negative for susceptible to infections.  Neurological:  Negative for dizziness and headaches.  Hematological:  Negative for swollen glands.  Psychiatric/Behavioral:  Positive for sleep disturbance. Negative for depressed mood. The patient is not nervous/anxious.     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   Sjogren's syndrome 02/14/2023   Chronic bilateral low back pain without sciatica 07/31/2022   Vitamin D  deficiency 06/04/2019   Prediabetes 05/05/2019   Cirrhosis (HCC)  09/12/2017   Back pain 06/20/2017   Hepatitis C 12/12/2016   Asthma 12/12/2016   Hypertension 12/12/2016   H/O: hysterectomy 12/12/2016   Laryngopharyngeal reflux (LPR) 06/13/2016   Voice disorder 05/31/2016   Obesity 01/05/2016   Elevation of level of transaminase and lactic acid dehydrogenase (LDH) 01/02/2016   Bronchitis 10/23/2012   Sinusitis 10/23/2012    Past Medical History:  Diagnosis Date   Allergy    Asthma    Back pain    Food allergy    GERD (gastroesophageal reflux disease)    Hepatitis C    Hypertension    Knee pain    Lactose intolerance    Migraines    Obesity    Palpitations    Vitamin D  deficiency     Family History  Problem Relation Age of Onset   Hypertension Mother    Diabetes Mother    Heart disease Mother    High Cholesterol Mother    Obesity Mother    Aneurysm Mother        ruptured 2009   Dilated cardiomyopathy Mother    Glucose-6-phos deficiency Father    Hypertension Father    Heart disease Father    Diabetes Father    Prostate cancer Father    Heart failure Father    Sleep apnea Father    Glucose-6-phos deficiency Sister    Hypertension Sister    Diabetes Sister    Hypertension Sister    Dementia Maternal Grandmother    Heart failure Paternal Grandmother    Stomach cancer Maternal Aunt    Rectal cancer Maternal Aunt    Diabetes Maternal Aunt    Stroke Maternal Aunt    Hypertension Maternal Aunt    Stomach cancer Maternal Uncle    Rectal cancer Maternal Uncle    Esophageal cancer Paternal Aunt    Esophageal cancer Paternal Uncle    Colon cancer Neg Hx    Colon polyps Neg Hx    Past Surgical History:  Procedure Laterality Date   ABDOMINAL HYSTERECTOMY  1997   left ovaries   OVARIAN CYST REMOVAL Right 1992   ruptured cyst   POLYDACTYLY RECONSTRUCTION     Social History   Tobacco Use   Smoking status: Never    Passive exposure: Past   Smokeless tobacco: Never  Vaping Use   Vaping status: Never Used  Substance Use  Topics   Alcohol use: No   Drug use: No   Social History   Social History Narrative   Not on file     Immunization History  Administered Date(s) Administered   Hepatitis A 09/12/2017   Hepatitis A, Adult 12/12/2016   Influenza-Unspecified 07/16/2017, 07/04/2021   PFIZER(Purple Top)SARS-COV-2 Vaccination 07/19/2020, 08/10/2020   Pfizer Covid-19 Vaccine Bivalent Booster 50yrs & up 05/30/2021   Pneumococcal Polysaccharide-23 09/12/2017     Objective: Vital Signs: BP 109/70 (BP Location: Right Arm, Patient Position: Sitting, Cuff Size: Normal)   Pulse 75   Temp 97.8 F (36.6 C)   Resp 16   Ht 5' 1 (1.549 m)   Wt 205 lb 6.4 oz (93.2 kg)   BMI 38.81 kg/m    Physical Exam Constitutional:      Appearance: She is obese.  HENT:     Mouth/Throat:     Mouth: Mucous membranes are moist.     Pharynx: Oropharynx is clear.     Comments: Multiple absent teeth and cavities Eyes:     Conjunctiva/sclera: Conjunctivae normal.     Comments: Dry eye, mild b/l conjunctival injection  Cardiovascular:     Rate and Rhythm: Normal rate and regular rhythm.  Pulmonary:     Effort: Pulmonary effort is normal.     Breath sounds: Normal breath sounds.  Musculoskeletal:     Right lower leg: No edema.     Left lower leg: No edema.  Lymphadenopathy:     Cervical: No cervical adenopathy.  Skin:    General: Skin is warm and dry.     Findings: No rash.     Comments: B/l lower leg venous varicosities Dry skin  Neurological:     Mental Status: She is alert.  Psychiatric:        Mood and Affect: Mood normal.      Musculoskeletal Exam:  Shoulders full ROM no tenderness or swelling Elbows full ROM no tenderness or swelling Wrists full ROM no tenderness or swelling Fingers full ROM no tenderness or swelling Knees full ROM, tenderness at the patellar tendon left knee, no effusion Ankles full ROM no tenderness or swelling MTPs full ROM no tenderness or swelling   Investigation: No  additional findings.  Imaging: No results found.  Recent Labs: Lab Results  Component Value Date   WBC 4.7 11/06/2016   HGB 15.0 11/06/2016   PLT 215 11/06/2016   NA 138 01/05/2022   K 3.9 01/05/2022   CL 106 01/05/2022   CO2 25 01/05/2022   GLUCOSE 99 01/05/2022   BUN 13 01/05/2022   CREATININE 0.78 01/05/2022   BILITOT 0.3 05/04/2019   ALKPHOS 89 05/04/2019   AST 22 05/04/2019   ALT 22 05/04/2019   PROT 7.8 05/04/2019   ALBUMIN 4.3 05/04/2019   CALCIUM 9.0 01/05/2022   GFRAA 113 05/04/2019    Speciality Comments: No specialty comments available.  Procedures:  No procedures performed Allergies: Cat dander, Norco [hydrocodone-acetaminophen], Dust mite extract, and Pollen extract   Assessment / Plan:     Visit Diagnoses: Positive ANA (antinuclear antibody) - Plan: RNP Antibody, Anti-Smith antibody, Sjogrens syndrome-A extractable nuclear antibody, Anti-DNA antibody, double-stranded, Sjogrens syndrome-B extractable nuclear antibody, C3 and C4, Sedimentation rate Positive ANA and elevated sedimentation rate. Family history of lupus. Primary symptom is dry eye. Discussed Sjogren's as an autoimmune disease affecting secretory glands. Explained treatment is often symptomatic. - Order blood tests for Sjogren's syndrome and autoimmune markers. - No major red flags for systemic disease activity outside of the chronic dry eye so if labs are positive recommend monitoring for several months versus starting systemic DMARD unless change in symptoms  Sjogren's syndrome with keratoconjunctivitis sicca Chronic dry eye syndrome Symptoms managed with preservative-free Refresh Plus drops. Floaters noted, unrelated to dry eye. Differential includes Sjogren's syndrome, no definitive diagnosis. - Continue Refresh Plus drops. - Request records from [facility].  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 Discussed ligament anatomy and arthritis impact. - Continue physical therapy exercises, added handout on patellar tendinitis ROM/strengthening   Orders: Orders Placed This Encounter  Procedures   RNP Antibody   Anti-Smith antibody   Sjogrens syndrome-A extractable nuclear antibody   Anti-DNA antibody, double-stranded   Sjogrens syndrome-B extractable nuclear antibody   C3 and C4  Sedimentation rate   No orders of the defined types were placed in this encounter.    Follow-Up Instructions: Return in about 6 months (around 01/07/2025) for New pt ?sjogrens f/u 6mos.   Lonni LELON Ester, MD  Note - This record has been created using [employer].  Chart creation errors have been sought, but may not always  have been located. Such creation errors do not reflect on  the standard of medical care.

## 2024-07-11 LAB — ANTI-SMITH ANTIBODY: ENA SM Ab Ser-aCnc: <1 AI

## 2024-07-11 LAB — SEDIMENTATION RATE: Sed Rate: 6 mm/h (ref 0–30)

## 2024-07-11 LAB — SJOGRENS SYNDROME-B EXTRACTABLE NUCLEAR ANTIBODY: SSB (La) (ENA) Antibody, IgG: <1 AI

## 2024-07-11 LAB — C3 AND C4
C3 Complement: 160 mg/dL (ref 83–193)
C4 Complement: 30 mg/dL (ref 15–57)

## 2024-07-11 LAB — ANTI-DNA ANTIBODY, DOUBLE-STRANDED: ds DNA Ab: 1 [IU]/mL

## 2024-07-11 LAB — SJOGRENS SYNDROME-A EXTRACTABLE NUCLEAR ANTIBODY: SSA (Ro) (ENA) Antibody, IgG: <1 AI

## 2024-07-11 LAB — RNP ANTIBODY: Ribonucleic Protein(ENA) Antibody, IgG: <1 AI

## 2024-08-03 ENCOUNTER — Ambulatory Visit
Admission: RE | Admit: 2024-08-03 | Discharge: 2024-08-03 | Disposition: A | Discharge status: Home / Self Care | Source: Ambulatory Visit | Attending: Nurse Practitioner | Admitting: Nurse Practitioner

## 2024-08-03 DIAGNOSIS — Z1231 Encounter for screening mammogram for malignant neoplasm of breast: Secondary | ICD-10-CM

## 2024-08-06 ENCOUNTER — Other Ambulatory Visit: Payer: Self-pay | Admitting: Nurse Practitioner

## 2024-08-06 DIAGNOSIS — B1920 Unspecified viral hepatitis C without hepatic coma: Secondary | ICD-10-CM

## 2024-08-06 DIAGNOSIS — K7469 Other cirrhosis of liver: Secondary | ICD-10-CM

## 2024-08-13 ENCOUNTER — Inpatient Hospital Stay
Admission: RE | Admit: 2024-08-13 | Discharge: 2024-08-13 | Attending: Nurse Practitioner | Admitting: Nurse Practitioner

## 2024-08-13 DIAGNOSIS — K7469 Other cirrhosis of liver: Secondary | ICD-10-CM

## 2024-08-13 DIAGNOSIS — B1920 Unspecified viral hepatitis C without hepatic coma: Secondary | ICD-10-CM

## 2024-10-15 ENCOUNTER — Ambulatory Visit: Admitting: Internal Medicine

## 2025-01-07 ENCOUNTER — Ambulatory Visit: Payer: Self-pay | Admitting: Internal Medicine
